# Patient Record
Sex: Female | Born: 1990 | Race: White | Hispanic: No | Marital: Single | Smoking: Former smoker
Health system: Southern US, Community
[De-identification: ages and names within clinical notes are randomized; demographics above are authoritative.]

## PROBLEM LIST (undated history)

## (undated) DIAGNOSIS — F419 Anxiety disorder, unspecified: Secondary | ICD-10-CM

## (undated) DIAGNOSIS — F909 Attention-deficit hyperactivity disorder, unspecified type: Secondary | ICD-10-CM

## (undated) DIAGNOSIS — F329 Major depressive disorder, single episode, unspecified: Secondary | ICD-10-CM

## (undated) DIAGNOSIS — Z9889 Other specified postprocedural states: Secondary | ICD-10-CM

## (undated) DIAGNOSIS — N83201 Unspecified ovarian cyst, right side: Secondary | ICD-10-CM

## (undated) DIAGNOSIS — N83202 Unspecified ovarian cyst, left side: Secondary | ICD-10-CM

## (undated) DIAGNOSIS — F32A Depression, unspecified: Secondary | ICD-10-CM

## (undated) HISTORY — PX: TONSILLECTOMY: SUR1361

## (undated) HISTORY — DX: Anxiety disorder, unspecified: F41.9

## (undated) HISTORY — DX: Unspecified ovarian cyst, left side: N83.202

## (undated) HISTORY — PX: TONSILLECTOMY AND ADENOIDECTOMY: SHX28

## (undated) HISTORY — DX: Attention-deficit hyperactivity disorder, unspecified type: F90.9

## (undated) HISTORY — DX: Major depressive disorder, single episode, unspecified: F32.9

## (undated) HISTORY — DX: Depression, unspecified: F32.A

## (undated) HISTORY — DX: Unspecified ovarian cyst, right side: N83.201

---
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
## 2011-07-16 ENCOUNTER — Encounter: Payer: Self-pay | Admitting: *Deleted

## 2011-07-16 ENCOUNTER — Emergency Department (HOSPITAL_COMMUNITY)
Admission: EM | Admit: 2011-07-16 | Discharge: 2011-07-16 | Discharge status: Against Medical Advice | Payer: BC Managed Care – PPO | Attending: Emergency Medicine | Admitting: Emergency Medicine

## 2011-07-16 DIAGNOSIS — R109 Unspecified abdominal pain: Secondary | ICD-10-CM | POA: Insufficient documentation

## 2011-07-16 DIAGNOSIS — R110 Nausea: Secondary | ICD-10-CM | POA: Insufficient documentation

## 2011-07-16 LAB — BASIC METABOLIC PANEL
BUN: 6 mg/dL (ref 6–23)
Calcium: 9.8 mg/dL (ref 8.4–10.5)
Creatinine, Ser: 0.68 mg/dL (ref 0.50–1.10)
GFR calc Af Amer: >90 mL/min (ref >90)

## 2011-07-16 LAB — URINALYSIS, ROUTINE W REFLEX MICROSCOPIC
Bilirubin Urine: NEGATIVE
Ketones, ur: NEGATIVE mg/dL
Leukocytes, UA: NEGATIVE
Nitrite: NEGATIVE
Protein, ur: NEGATIVE mg/dL

## 2011-07-16 LAB — POCT I-STAT, CHEM 8
Calcium, Ion: 1.22 mmol/L (ref 1.12–1.32)
Chloride: 105 mEq/L (ref 96–112)
Creatinine, Ser: 0.6 mg/dL (ref 0.50–1.10)
Glucose, Bld: 93 mg/dL (ref 70–99)
HCT: 45 % (ref 36.0–46.0)
Hemoglobin: 15.3 g/dL — ABNORMAL HIGH (ref 12.0–15.0)
Potassium: 3.8 mEq/L (ref 3.5–5.1)

## 2011-07-16 LAB — DIFFERENTIAL
Basophils Absolute: 0 10*3/uL (ref 0.0–0.1)
Basophils Relative: 0 % (ref 0–1)
Eosinophils Absolute: 0.1 10*3/uL (ref 0.0–0.7)
Eosinophils Relative: 1 % (ref 0–5)
Monocytes Absolute: 0.8 10*3/uL (ref 0.1–1.0)
Monocytes Relative: 8 % (ref 3–12)
Neutro Abs: 6.8 10*3/uL (ref 1.7–7.7)

## 2011-07-16 LAB — CBC
HCT: 41.7 % (ref 36.0–46.0)
MCH: 29.8 pg (ref 26.0–34.0)
MCHC: 34.1 g/dL (ref 30.0–36.0)
MCV: 87.4 fL (ref 78.0–100.0)
RDW: 12.3 % (ref 11.5–15.5)

## 2011-07-16 MED ORDER — ONDANSETRON HCL 4 MG/2ML IJ SOLN: 4.0000 mg | Freq: Once | INTRAMUSCULAR | Status: DC

## 2011-07-16 MED ORDER — SODIUM CHLORIDE 0.9 % IV BOLUS (SEPSIS): 1000.0000 mL | Freq: Once | INTRAVENOUS | Status: DC

## 2011-07-16 MED ORDER — HYDROMORPHONE HCL PF 1 MG/ML IJ SOLN: 1.0000 mg | Freq: Once | INTRAMUSCULAR | Status: DC

## 2011-07-16 NOTE — ED Notes (Signed)
Pt c/o of intermittent lower abdominal pain. 10/10 pain. Last bowel movement was yesterday morning. Pt report it to be normal. Denies any dysuria. Pt reports nausea last night.

## 2011-07-17 NOTE — ED Provider Notes (Signed)
History     CSN: [id number] Arrival date & time: 07/16/2011  1:31 AM   First MD Initiated Contact with Patient 07/16/11 0214      Chief Complaint  Patient presents with  . Abdominal Pain  . Abdominal Cramping    (Consider location/radiation/quality/duration/timing/severity/associated sxs/prior treatment) HPI Comments: Patient here with intermittent suprapubic lower abdominal pain - she states that since being on the implanon she does not have a period and will have occasional spotting - she states no fever or chills, denies vomiting but reports nausea last night - states no vaginal discharge or bleeding, no dysuria, flank pain.  She states that when the pain comes on it is sharp and stabbing, reports that right now her pain is low at 3/10  Patient is a 20 y.o. female presenting with abdominal pain and cramps. The history is provided by the patient and a parent. No language interpreter was used.  Abdominal Pain The primary symptoms of the illness include abdominal pain and nausea. The primary symptoms of the illness do not include fever, fatigue, shortness of breath, vomiting, diarrhea, hematochezia, dysuria, vaginal discharge or vaginal bleeding. The current episode started yesterday. The onset of the illness was gradual. The problem has not changed since onset. The patient states that she believes she is currently not pregnant. The patient has not had a change in bowel habit. Symptoms associated with the illness do not include chills, anorexia, heartburn, constipation, urgency, hematuria, frequency or back pain.  Abdominal Cramping The primary symptoms of the illness include abdominal pain and nausea. The primary symptoms of the illness do not include fever, fatigue, shortness of breath, vomiting, diarrhea, hematochezia, dysuria, vaginal discharge or vaginal bleeding.  Symptoms associated with the illness do not include chills, anorexia, heartburn, constipation, urgency, hematuria, frequency  or back pain.    History reviewed. No pertinent past medical history.  Past Surgical History  Procedure Date  . Tonsillectomy and adenoidectomy     History reviewed. No pertinent family history.  History  Substance Use Topics  . Smoking status: Former Games developer  . Smokeless tobacco: Not on file  . Alcohol Use: Yes    OB History    Grav Para Term Preterm Abortions TAB SAB Ect Mult Living                  Review of Systems  Constitutional: Negative for fever, chills and fatigue.  Respiratory: Negative for shortness of breath.   Gastrointestinal: Positive for nausea and abdominal pain. Negative for heartburn, vomiting, diarrhea, constipation, hematochezia and anorexia.  Genitourinary: Negative for dysuria, urgency, frequency, hematuria, vaginal bleeding and vaginal discharge.  Musculoskeletal: Negative for back pain.  All other systems reviewed and are negative.    Allergies  Review of patient's allergies indicates no known allergies.  Home Medications   Current Outpatient Rx  Name Route Sig Dispense Refill  . AMPHETAMINE-DEXTROAMPHETAMINE 20 MG PO TABS Oral Take 20 mg by mouth daily.      . IMPLANON [place] Subcutaneous Inject into the skin.      Marland Kitchen SERTRALINE HCL 25 MG PO TABS Oral Take by mouth daily.        BP 107/70  Pulse 72  Temp(Src) 98.7 F (37.1 C) (Oral)  Resp 18  SpO2 100%  Physical Exam  Nursing note and vitals reviewed. Constitutional: She is oriented to person, place, and time. She appears well-developed and well-nourished. No distress.  HENT:  Head: Normocephalic and atraumatic.  Right Ear: External ear normal.  Left  Ear: External ear normal.  Nose: Nose normal.  Mouth/Throat: Oropharynx is clear and moist. No oropharyngeal exudate.  Eyes: Conjunctivae are normal. Pupils are equal, round, and reactive to light.  Neck: Normal range of motion. Neck supple.  Cardiovascular: Normal rate and normal heart sounds.  Exam reveals no gallop and no friction  rub.   No murmur heard. Pulmonary/Chest: Effort normal and breath sounds normal.  Abdominal: Soft. Bowel sounds are normal. There is no hepatosplenomegaly. There is tenderness in the suprapubic area. There is no rigidity, no rebound, no guarding, no CVA tenderness and no tenderness at McBurney's point.    Genitourinary:       As this patient was seen in triage and sent back out to the waiting room - I had ordered a pelvic exam but she left before it could be done.  Musculoskeletal: Normal range of motion.  Neurological: She is alert and oriented to person, place, and time. No cranial nerve deficit.  Skin: Skin is warm and dry.  Psychiatric: She has a normal mood and affect. Her behavior is normal. Judgment and thought content normal.    ED Course  Procedures (including critical care time)  Labs Reviewed  POCT I-STAT, CHEM 8 - Abnormal; Notable for the following:    BUN 4 (*)    Hemoglobin 15.3 (*)    All other components within normal limits  CBC  DIFFERENTIAL  BASIC METABOLIC PANEL  URINALYSIS, ROUTINE W REFLEX MICROSCOPIC  PREGNANCY, URINE  LAB REPORT - SCANNED   No results found.  Abdominal pain   MDM  The patient was screened by me in triage and then sent back into the waiting room - when she was called for her room she was not there.  She was afebrile with normal vital signs before leaving, her pain was episodic in nature and I did not have a suspicion for a surgical issue.  At the time of my examination her pain was 3/10 and she did not appear to be that uncomfortable.        Izola Price Fall[place], [place] 07/17/11 [phone]

## 2011-07-25 NOTE — ED Provider Notes (Signed)
Medical screening examination/treatment/procedure(s) were performed by non-physician practitioner and as supervising physician I was immediately available for consultation/collaboration.   Carlisle Beers Maci Eickholt, MD 07/25/11 0700

## 2011-09-05 ENCOUNTER — Ambulatory Visit (INDEPENDENT_AMBULATORY_CARE_PROVIDER_SITE_OTHER): Payer: BC Managed Care – PPO

## 2012-03-01 ENCOUNTER — Ambulatory Visit (INDEPENDENT_AMBULATORY_CARE_PROVIDER_SITE_OTHER): Payer: BC Managed Care – PPO | Admitting: Family Medicine

## 2012-03-01 ENCOUNTER — Other Ambulatory Visit: Payer: Self-pay | Admitting: Family Medicine

## 2012-03-01 ENCOUNTER — Ambulatory Visit: Payer: BC Managed Care – PPO

## 2012-03-01 VITALS — BP 118/72 | HR 86 | Temp 97.8°F | Resp 17 | Ht 65.0 in | Wt 139.0 lb

## 2012-03-01 DIAGNOSIS — R1032 Left lower quadrant pain: Secondary | ICD-10-CM

## 2012-03-01 DIAGNOSIS — R112 Nausea with vomiting, unspecified: Secondary | ICD-10-CM

## 2012-03-01 DIAGNOSIS — R300 Dysuria: Secondary | ICD-10-CM

## 2012-03-01 LAB — COMPREHENSIVE METABOLIC PANEL WITH GFR
BUN: 12 mg/dL (ref 6–23)
CO2: 26 meq/L (ref 19–32)
Calcium: 10.3 mg/dL (ref 8.4–10.5)
Creat: 0.94 mg/dL (ref 0.50–1.10)
Glucose, Bld: 89 mg/dL (ref 70–99)
Total Bilirubin: 0.5 mg/dL (ref 0.3–1.2)

## 2012-03-01 LAB — POCT CBC
Granulocyte percent: 65.4 %G (ref 37–80)
HCT, POC: 52 % — AB (ref 37.7–47.9)
Hemoglobin: 16.2 g/dL (ref 12.2–16.2)
Lymph, poc: 2.7 (ref 0.6–3.4)
MCH, POC: 29.4 pg (ref 27–31.2)
MCHC: 31.2 g/dL — AB (ref 31.8–35.4)
MCV: 94.4 fL (ref 80–97)
MID (cbc): 0.7 (ref 0–0.9)
MPV: 7.7 fL (ref 0–99.8)
POC Granulocyte: 6.5 (ref 2–6.9)
POC LYMPH PERCENT: 27.7 %L (ref 10–50)
POC MID %: 6.9 % (ref 0–12)
Platelet Count, POC: 428 10*3/uL — AB (ref 142–424)
RBC: 5.51 M/uL — AB (ref 4.04–5.48)
RDW, POC: 13.1 %
WBC: 9.9 10*3/uL (ref 4.6–10.2)

## 2012-03-01 LAB — POCT UA - MICROSCOPIC ONLY
Casts, Ur, LPF, POC: NEGATIVE
Crystals, Ur, HPF, POC: NEGATIVE
Mucus, UA: NEGATIVE
Yeast, UA: NEGATIVE

## 2012-03-01 LAB — POCT URINALYSIS DIPSTICK
Bilirubin, UA: NEGATIVE
Glucose, UA: NEGATIVE
Ketones, UA: NEGATIVE
Nitrite, UA: NEGATIVE
Protein, UA: NEGATIVE
Spec Grav, UA: 1.025
Urobilinogen, UA: 0.2
pH, UA: 6

## 2012-03-01 LAB — COMPREHENSIVE METABOLIC PANEL
ALT: 12 U/L (ref 0–35)
AST: 16 U/L (ref 0–37)
Albumin: 4.9 g/dL (ref 3.5–5.2)
Alkaline Phosphatase: 60 U/L (ref 39–117)
Chloride: 109 mEq/L (ref 96–112)
Potassium: 4.7 mEq/L (ref 3.5–5.3)
Sodium: 140 mEq/L (ref 135–145)
Total Protein: 7.2 g/dL (ref 6.0–8.3)

## 2012-03-01 MED ORDER — KETOROLAC TROMETHAMINE 60 MG/2ML IM SOLN
60.0000 mg | Freq: Once | INTRAMUSCULAR | Status: AC
  Administered 2012-03-01: 60 mg via INTRAMUSCULAR

## 2012-03-01 MED ORDER — ONDANSETRON 8 MG PO TBDP: 8.0000 mg | ORAL_TABLET | Freq: Three times a day (TID) | ORAL | Status: AC | PRN

## 2012-03-01 MED ORDER — HYDROCODONE-ACETAMINOPHEN 5-500 MG PO TABS: 1.0000 | ORAL_TABLET | Freq: Three times a day (TID) | ORAL | Status: AC | PRN

## 2012-03-01 NOTE — Progress Notes (Signed)
[facility]:  Office Visit  Chief Complaint:  Chief Complaint  Patient presents with  . Back Pain    patient thinks she has a kidney stone     HPI: Suzanne Rice is a 21 y.o. female who complains of  Left sided abd pain which started this AM, started on left back pain and has now migrated to anterior left abd. Sharp 10/10 pain, has not tried anything. Deneis h/o kidney stones, has a h.o ovarian cysts but feels different. Was able to eat and drink well last night. Associated with Nausea but no vomiting. No abd surgery. Able to pass gas, last BM 2 days ago-was normal.  Past Medical History  Diagnosis Date  . Bilateral ovarian cysts   . Depression   . Anxiety   . ADHD (attention deficit hyperactivity disorder)    Past Surgical History  Procedure Date  . Tonsillectomy and adenoidectomy   . Tonsillectomy    History   Social History  . Marital Status: Single    Spouse Name: N/A    Number of Children: N/A  . Years of Education: N/A   Social History Main Topics  . Smoking status: Former Games developer  . Smokeless tobacco: None  . Alcohol Use: Yes  . Drug Use: No  . Sexually Active: Yes    Birth Control/ Protection: Condom, Pill   Other Topics Concern  . None   Social History Narrative  . None   No family history on file. No Known Allergies Prior to Admission medications   Medication Sig Start Date End Date Taking? Authorizing Provider  amphetamine-dextroamphetamine (ADDERALL) 20 MG tablet Take 20 mg by mouth daily.     Yes Historical Provider, MD  Etonogestrel (IMPLANON [place]) Inject into the skin.     Yes Historical Provider, MD  sertraline (ZOLOFT) 25 MG tablet Take by mouth daily.     Yes Historical Provider, MD     ROS: The patient denies fevers, chills, night sweats, unintentional weight loss, chest pain, palpitations, wheezing, dyspnea on exertion, + nausea, vomiting, abdominal pain, dysuria, hematuria, melena, numbness, weakness, or tingling.   All  other systems have been reviewed and were otherwise negative with the exception of those mentioned in the HPI and as above.    PHYSICAL EXAM: Filed Vitals:   03/01/12 1021  BP: 118/72  Pulse: 86  Temp: 97.8 F (36.6 C)  Resp: 17   Filed Vitals:   03/01/12 1021  Height: 5\' 5"  (1.651 m)  Weight: 139 lb (63.05 kg)   Body mass index is 23.13 kg/(m^2).  General: Alert, no acute distress HEENT:  Normocephalic, atraumatic, oropharynx patent.  Cardiovascular:  Regular rate and rhythm, no rubs murmurs or gallops.  No Carotid bruits, radial pulse intact. No pedal edema.  Respiratory: Clear to auscultation bilaterally.  No wheezes, rales, or rhonchi.  No cyanosis, no use of accessory musculature GI: No organomegaly, abdomen is soft and  + tender LLQ, positive bowel sounds.  No masses. Skin: No rashes. Neurologic: Facial musculature symmetric. Psychiatric: Patient is appropriate throughout our interaction. Lymphatic: No cervical lymphadenopathy Musculoskeletal: Gait intact. No CVA tenderness   LABS: Results for orders placed in visit on 03/01/12  POCT CBC      Component Value Range   WBC 9.9  4.6 - 10.2 K/uL   Lymph, poc 2.7  0.6 - 3.4   POC LYMPH PERCENT 27.7  10 - 50 %L   MID (cbc) 0.7  0 - 0.9  POC MID % 6.9  0 - 12 %M   POC Granulocyte 6.5  2 - 6.9   Granulocyte percent 65.4  37 - 80 %G   RBC 5.51 (*) 4.04 - 5.48 M/uL   Hemoglobin 16.2  12.2 - 16.2 g/dL   HCT, POC 16.1 (*) 09.6 - 47.9 %   MCV 94.4  80 - 97 fL   MCH, POC 29.4  27 - 31.2 pg   MCHC 31.2 (*) 31.8 - 35.4 g/dL   RDW, POC 04.5     Platelet Count, POC 428 (*) 142 - 424 K/uL   MPV 7.7  0 - 99.8 fL  POCT UA - MICROSCOPIC ONLY      Component Value Range   WBC, Ur, HPF, POC 1-4     RBC, urine, microscopic 4-6     Bacteria, U Microscopic trace     Mucus, UA neg     Epithelial cells, urine per micros 2-3     Crystals, Ur, HPF, POC neg     Casts, Ur, LPF, POC neg     Yeast, UA neg    POCT URINALYSIS DIPSTICK       Component Value Range   Color, UA yellow     Clarity, UA clear     Glucose, UA neg     Bilirubin, UA neg     Ketones, UA neg     Spec Grav, UA 1.025     Blood, UA mod     pH, UA 6.0     Protein, UA neg     Urobilinogen, UA 0.2     Nitrite, UA neg     Leukocytes, UA Trace       EKG/XRAY:   Primary read interpreted by Dr. Conley Rolls at [facility]. ? 12 mm renal stone LUQ abd + fecal matter,  Min dilation of large colon in LUQ abd   ASSESSMENT/PLAN: Encounter Diagnoses  Name Primary?  . Abdominal pain, acute, left lower quadrant Yes  . Dysuria   . Nausea & vomiting    ? Renal stone ( more likely) vs constipation Toradol 60 mg IM x 1 Vocodina and Zofran,  miralax Push fluids F/u prn    [name], DO 03/01/2012 11:52 AM

## 2012-03-03 ENCOUNTER — Telehealth: Payer: Self-pay | Admitting: Family Medicine

## 2012-03-03 DIAGNOSIS — R109 Unspecified abdominal pain: Secondary | ICD-10-CM

## 2012-03-03 LAB — URINE CULTURE
Colony Count: NO GROWTH
Organism ID, Bacteria: NO GROWTH

## 2012-03-03 NOTE — Telephone Encounter (Signed)
Spoke with patient about xray results and urine labs. She is having less pain. Desires a CT scan even though I d/w her the radiation potential and risk associated with it. She states that she still wants to have it even though she is feeling better. I advise her to wait but patient states she still has pressure on her bladder. She does have a h/o ovarian cyst.

## 2012-03-03 NOTE — Telephone Encounter (Signed)
Spoke with mom who was present for OV, patient is feeling better, no more pain after injection and fluids. She has not taken vicodin. Recommend stool softener as planned.

## 2012-03-16 ENCOUNTER — Other Ambulatory Visit: Payer: Self-pay | Admitting: Family Medicine

## 2012-03-16 DIAGNOSIS — R1032 Left lower quadrant pain: Secondary | ICD-10-CM

## 2013-01-31 LAB — OB RESULTS CONSOLE RPR: RPR: NONREACTIVE

## 2013-01-31 LAB — OB RESULTS CONSOLE ABO/RH: RH Type: NEGATIVE

## 2013-01-31 LAB — OB RESULTS CONSOLE GC/CHLAMYDIA
CHLAMYDIA, DNA PROBE: NEGATIVE
GC PROBE AMP, GENITAL: NEGATIVE

## 2013-01-31 LAB — OB RESULTS CONSOLE ANTIBODY SCREEN: ANTIBODY SCREEN: NEGATIVE

## 2013-01-31 LAB — OB RESULTS CONSOLE RUBELLA ANTIBODY, IGM: Rubella: IMMUNE

## 2013-01-31 LAB — OB RESULTS CONSOLE HEPATITIS B SURFACE ANTIGEN: Hepatitis B Surface Ag: NEGATIVE

## 2013-01-31 LAB — OB RESULTS CONSOLE HIV ANTIBODY (ROUTINE TESTING): HIV: NONREACTIVE

## 2013-02-27 ENCOUNTER — Other Ambulatory Visit: Payer: Self-pay

## 2013-04-12 ENCOUNTER — Other Ambulatory Visit (HOSPITAL_COMMUNITY): Payer: Self-pay | Admitting: Obstetrics & Gynecology

## 2013-04-12 DIAGNOSIS — O283 Abnormal ultrasonic finding on antenatal screening of mother: Secondary | ICD-10-CM

## 2013-04-18 ENCOUNTER — Other Ambulatory Visit: Payer: Self-pay

## 2013-04-18 ENCOUNTER — Ambulatory Visit (HOSPITAL_COMMUNITY)
Admission: RE | Admit: 2013-04-18 | Discharge: 2013-04-18 | Disposition: A | Discharge status: Home / Self Care | Payer: BC Managed Care – PPO | Source: Ambulatory Visit | Attending: Obstetrics & Gynecology | Admitting: Obstetrics & Gynecology

## 2013-04-18 ENCOUNTER — Encounter (HOSPITAL_COMMUNITY): Payer: Self-pay

## 2013-04-18 VITALS — BP 112/77 | HR 68 | Wt 162.0 lb

## 2013-04-18 DIAGNOSIS — O351XX0 Maternal care for (suspected) chromosomal abnormality in fetus, not applicable or unspecified: Secondary | ICD-10-CM | POA: Insufficient documentation

## 2013-04-18 DIAGNOSIS — Z3689 Encounter for other specified antenatal screening: Secondary | ICD-10-CM | POA: Insufficient documentation

## 2013-04-18 DIAGNOSIS — O09899 Supervision of other high risk pregnancies, unspecified trimester: Secondary | ICD-10-CM | POA: Insufficient documentation

## 2013-04-18 DIAGNOSIS — O99340 Other mental disorders complicating pregnancy, unspecified trimester: Secondary | ICD-10-CM | POA: Insufficient documentation

## 2013-04-18 DIAGNOSIS — F1910 Other psychoactive substance abuse, uncomplicated: Secondary | ICD-10-CM | POA: Insufficient documentation

## 2013-04-18 DIAGNOSIS — O283 Abnormal ultrasonic finding on antenatal screening of mother: Secondary | ICD-10-CM

## 2013-04-18 DIAGNOSIS — O358XX0 Maternal care for other (suspected) fetal abnormality and damage, not applicable or unspecified: Secondary | ICD-10-CM | POA: Insufficient documentation

## 2013-04-18 DIAGNOSIS — O3510X0 Maternal care for (suspected) chromosomal abnormality in fetus, unspecified, not applicable or unspecified: Secondary | ICD-10-CM | POA: Insufficient documentation

## 2013-04-18 NOTE — Progress Notes (Signed)
Suzanne Rice  was seen today for an ultrasound appointment.  See full report in [facility].  Comments: Suzanne Rice was seen today due to thickened nuchal fold and echogenic intracardiac focus that was noted on recent clinic ultrasound.  She previously had a normal first trimester screen and normal nuchal translucency.  On exam today, the nuchal fold measures 6-7 mm and an echogenic intracardiac focus was noted.  The findings and limitations of the study were discussed.  Both thickened nuchal translucency and echogenic intracardiac focus are considered weak markers for Down syndrome and together are associated with a liklihood ratio of approximately 10.  Nevertheless, based on her apriori risk for Trisomy 72, she is still at low risk.  After counseling, the patient elected to undergo cell free fetal DNA ([facility])  Impression: Single IUP at 19 4/7 weeks Normal first trimester screen Thickened nuchal fold, echogenic intracardiac focus- no other soft markers noted Fetal anatomy is otherwise within normal limits; somewhat limited views of the fetal heart obtained (arches) Normal amniotic fluid volume  Recommendations: Recommend follow-up ultrasound examination in 4 weeks to reevaluate the fetal heart.  Alpha Gula, MD

## 2013-05-01 ENCOUNTER — Telehealth (HOSPITAL_COMMUNITY): Payer: Self-pay | Admitting: MS"

## 2013-05-01 NOTE — Telephone Encounter (Signed)
Called Suzanne Rice to discuss her cell free fetal DNA test results.  Suzanne Rice had Harm[facility] testing through [facility].  Testing was offered because of previous ultrasound findings of thickened nuchal fold and echogenic intracardiac focus.   The patient was identified by name and DOB.  We reviewed that these are within normal limits, showing a less than 1 in 10,000 risk for trisomies 21, 18 and 13. X and Y analysis did not show evidence of sex chromosome aneuploidy, and results were consistent with female gender.  We reviewed that this testing identifies > 99% of pregnancies with trisomy 21, >98% of pregnancies with trisomy 70, and approximately 80% of pregnancies with trisomy 73.   The false positive rate is <0.1% for all conditions.  She understands that this testing does not identify all genetic conditions.  All questions were answered to her satisfaction, she was encouraged to call with additional questions or concerns.  Quinn Plowman, MS Certified Genetic Counselor 05/01/2013 9:14 AM

## 2013-05-16 ENCOUNTER — Ambulatory Visit (HOSPITAL_COMMUNITY)
Admission: RE | Admit: 2013-05-16 | Discharge: 2013-05-16 | Disposition: A | Discharge status: Home / Self Care | Payer: BC Managed Care – PPO | Source: Ambulatory Visit | Attending: Obstetrics & Gynecology | Admitting: Obstetrics & Gynecology

## 2013-05-16 DIAGNOSIS — O09899 Supervision of other high risk pregnancies, unspecified trimester: Secondary | ICD-10-CM | POA: Insufficient documentation

## 2013-05-16 DIAGNOSIS — O358XX0 Maternal care for other (suspected) fetal abnormality and damage, not applicable or unspecified: Secondary | ICD-10-CM | POA: Insufficient documentation

## 2013-05-16 DIAGNOSIS — F1910 Other psychoactive substance abuse, uncomplicated: Secondary | ICD-10-CM | POA: Insufficient documentation

## 2013-05-16 DIAGNOSIS — O283 Abnormal ultrasonic finding on antenatal screening of mother: Secondary | ICD-10-CM

## 2013-05-16 DIAGNOSIS — O99340 Other mental disorders complicating pregnancy, unspecified trimester: Secondary | ICD-10-CM | POA: Insufficient documentation

## 2013-08-03 NOTE — L&D Delivery Note (Signed)
SVD of VMI at 1030 on 08/21/13.  EBL: 400cc.  Placenta to L&D.  APGARs 8,9. Head delivered ROA with body following atraumatically.  Mouth and nose bulb suctioned.  Cord clamped, cut, and baby to abdomen.  Cord pH obtained.  Placenta delivered S/I/3VC.  Fundus firmed with pitocin and massage.  Left vaginal laceration repaired in the normal fashion with 3-0 Vicryl Rapide.  Right periurethral laceration hemostatic without repair.  Mom and baby stable.

## 2013-08-12 LAB — OB RESULTS CONSOLE GBS: GBS: NEGATIVE

## 2013-08-20 ENCOUNTER — Inpatient Hospital Stay (HOSPITAL_COMMUNITY): Payer: BC Managed Care – PPO | Admitting: Anesthesiology

## 2013-08-20 ENCOUNTER — Encounter (HOSPITAL_COMMUNITY): Payer: BC Managed Care – PPO | Admitting: Anesthesiology

## 2013-08-20 ENCOUNTER — Inpatient Hospital Stay (HOSPITAL_COMMUNITY)
Admission: AD | Admit: 2013-08-20 | Discharge: 2013-08-22 | DRG: 775 | Disposition: A | Discharge status: Home / Self Care | Payer: BC Managed Care – PPO | Source: Ambulatory Visit | Attending: Obstetrics & Gynecology | Admitting: Obstetrics & Gynecology

## 2013-08-20 ENCOUNTER — Encounter (HOSPITAL_COMMUNITY): Payer: Self-pay | Admitting: *Deleted

## 2013-08-20 DIAGNOSIS — Z3490 Encounter for supervision of normal pregnancy, unspecified, unspecified trimester: Secondary | ICD-10-CM

## 2013-08-20 DIAGNOSIS — Z87891 Personal history of nicotine dependence: Secondary | ICD-10-CM

## 2013-08-20 LAB — CBC
HEMATOCRIT: 38.6 % (ref 36.0–46.0)
Hemoglobin: 13.8 g/dL (ref 12.0–15.0)
MCH: 31.1 pg (ref 26.0–34.0)
MCHC: 35.8 g/dL (ref 30.0–36.0)
MCV: 86.9 fL (ref 78.0–100.0)
PLATELETS: 290 10*3/uL (ref 150–400)
RBC: 4.44 MIL/uL (ref 3.87–5.11)
RDW: 12.5 % (ref 11.5–15.5)
WBC: 16.9 10*3/uL — ABNORMAL HIGH (ref 4.0–10.5)

## 2013-08-20 LAB — TYPE AND SCREEN
ABO/RH(D): O POS
Antibody Screen: NEGATIVE

## 2013-08-20 LAB — RPR: RPR Ser Ql: NONREACTIVE

## 2013-08-20 LAB — ABO/RH: ABO/RH(D): O POS

## 2013-08-20 MED ORDER — OXYTOCIN BOLUS FROM INFUSION: 500.0000 mL | INTRAVENOUS | Status: DC

## 2013-08-20 MED ORDER — LIDOCAINE HCL (PF) 1 % IJ SOLN
INTRAMUSCULAR | Status: DC | PRN
  Administered 2013-08-20 (×2): 5 mL

## 2013-08-20 MED ORDER — OXYTOCIN 40 UNITS IN LACTATED RINGERS INFUSION - SIMPLE MED
62.5000 mL/h | INTRAVENOUS | Status: DC
  Administered 2013-08-21: 62.5 mL/h via INTRAVENOUS

## 2013-08-20 MED ORDER — LIDOCAINE HCL (PF) 1 % IJ SOLN: 30.0000 mL | INTRAMUSCULAR | Status: DC | PRN

## 2013-08-20 MED ORDER — EPHEDRINE 5 MG/ML INJ
10.0000 mg | INTRAVENOUS | Status: AC | PRN
  Administered 2013-08-20 (×2): 10 mg via INTRAVENOUS

## 2013-08-20 MED ORDER — DIPHENHYDRAMINE HCL 50 MG/ML IJ SOLN: 12.5000 mg | INTRAMUSCULAR | Status: DC | PRN

## 2013-08-20 MED ORDER — LACTATED RINGERS IV SOLN: INTRAVENOUS | Status: DC

## 2013-08-20 MED ORDER — ONDANSETRON HCL 4 MG/2ML IJ SOLN: 4.0000 mg | Freq: Four times a day (QID) | INTRAMUSCULAR | Status: DC | PRN

## 2013-08-20 MED ORDER — ACETAMINOPHEN 325 MG PO TABS
650.0000 mg | ORAL_TABLET | ORAL | Status: DC | PRN
Start: 2013-08-20 — End: 2013-08-20

## 2013-08-20 MED ORDER — ACETAMINOPHEN 325 MG PO TABS: 650.0000 mg | ORAL_TABLET | ORAL | Status: DC | PRN

## 2013-08-20 MED ORDER — CITRIC ACID-SODIUM CITRATE 334-500 MG/5ML PO SOLN: 30.0000 mL | ORAL | Status: DC | PRN

## 2013-08-20 MED ORDER — OXYCODONE-ACETAMINOPHEN 5-325 MG PO TABS: 1.0000 | ORAL_TABLET | ORAL | Status: DC | PRN

## 2013-08-20 MED ORDER — FLEET ENEMA 7-19 GM/118ML RE ENEM: 1.0000 | ENEMA | RECTAL | Status: DC | PRN

## 2013-08-20 MED ORDER — LACTATED RINGERS IV SOLN: 500.0000 mL | INTRAVENOUS | Status: DC | PRN

## 2013-08-20 MED ORDER — PHENYLEPHRINE 40 MCG/ML (10ML) SYRINGE FOR IV PUSH (FOR BLOOD PRESSURE SUPPORT)
80.0000 ug | PREFILLED_SYRINGE | INTRAVENOUS | Status: DC | PRN
  Filled 2013-08-20: qty 2

## 2013-08-20 MED ORDER — TERBUTALINE SULFATE 1 MG/ML IJ SOLN: 0.2500 mg | Freq: Once | INTRAMUSCULAR | Status: AC | PRN

## 2013-08-20 MED ORDER — LIDOCAINE HCL (PF) 1 % IJ SOLN
30.0000 mL | INTRAMUSCULAR | Status: DC | PRN
  Filled 2013-08-20: qty 30

## 2013-08-20 MED ORDER — PROMETHAZINE HCL 25 MG/ML IJ SOLN
12.5000 mg | Freq: Four times a day (QID) | INTRAMUSCULAR | Status: DC | PRN
  Administered 2013-08-20: 12.5 mg via INTRAVENOUS
  Filled 2013-08-20: qty 1

## 2013-08-20 MED ORDER — LACTATED RINGERS IV SOLN
INTRAVENOUS | Status: DC
  Administered 2013-08-20 – 2013-08-21 (×2): via INTRAVENOUS

## 2013-08-20 MED ORDER — FENTANYL 2.5 MCG/ML BUPIVACAINE 1/10 % EPIDURAL INFUSION (WH - ANES)
14.0000 mL/h | INTRAMUSCULAR | Status: DC | PRN
  Administered 2013-08-20 – 2013-08-21 (×2): 14 mL/h via EPIDURAL
  Filled 2013-08-20 (×2): qty 125

## 2013-08-20 MED ORDER — LACTATED RINGERS IV SOLN
500.0000 mL | Freq: Once | INTRAVENOUS | Status: AC
  Administered 2013-08-20: 500 mL via INTRAVENOUS

## 2013-08-20 MED ORDER — LACTATED RINGERS IV SOLN
500.0000 mL | INTRAVENOUS | Status: DC | PRN
  Administered 2013-08-21: 500 mL via INTRAVENOUS

## 2013-08-20 MED ORDER — PHENYLEPHRINE 40 MCG/ML (10ML) SYRINGE FOR IV PUSH (FOR BLOOD PRESSURE SUPPORT)
80.0000 ug | PREFILLED_SYRINGE | INTRAVENOUS | Status: DC | PRN
  Filled 2013-08-20: qty 2
  Filled 2013-08-20 (×2): qty 10

## 2013-08-20 MED ORDER — OXYTOCIN 40 UNITS IN LACTATED RINGERS INFUSION - SIMPLE MED
1.0000 m[IU]/min | INTRAVENOUS | Status: DC
  Administered 2013-08-20: 2 m[IU]/min via INTRAVENOUS
  Filled 2013-08-20: qty 1000

## 2013-08-20 MED ORDER — EPHEDRINE 5 MG/ML INJ
10.0000 mg | INTRAVENOUS | Status: DC | PRN
  Filled 2013-08-20: qty 4
  Filled 2013-08-20: qty 2
  Filled 2013-08-20: qty 4

## 2013-08-20 MED ORDER — OXYTOCIN 40 UNITS IN LACTATED RINGERS INFUSION - SIMPLE MED: 62.5000 mL/h | INTRAVENOUS | Status: DC

## 2013-08-20 MED ORDER — IBUPROFEN 600 MG PO TABS: 600.0000 mg | ORAL_TABLET | Freq: Four times a day (QID) | ORAL | Status: DC | PRN

## 2013-08-20 MED ORDER — BUTORPHANOL TARTRATE 1 MG/ML IJ SOLN
1.0000 mg | INTRAMUSCULAR | Status: DC | PRN
  Administered 2013-08-20: 1 mg via INTRAVENOUS
  Filled 2013-08-20: qty 1

## 2013-08-20 MED ORDER — FLEET ENEMA 7-19 GM/118ML RE ENEM: 1.0000 | ENEMA | Freq: Once | RECTAL | Status: DC

## 2013-08-20 MED ORDER — IBUPROFEN 600 MG PO TABS
600.0000 mg | ORAL_TABLET | Freq: Four times a day (QID) | ORAL | Status: DC | PRN
  Administered 2013-08-21: 600 mg via ORAL
  Filled 2013-08-20: qty 1

## 2013-08-20 MED ORDER — OXYCODONE-ACETAMINOPHEN 5-325 MG PO TABS
1.0000 | ORAL_TABLET | ORAL | Status: DC | PRN
  Administered 2013-08-21: 1 via ORAL
  Filled 2013-08-20: qty 1

## 2013-08-20 NOTE — H&P (Signed)
Suzanne Rice is a 23 y.o. female presenting at 6637.2 with SROM.  Prenatal course complicated by increased thickness of nuchal fold and echogenic intraventricular foci.  Cell free fetal DNA testing revealed normal genetics.  GBS negative. Maternal Medical History:  Reason for admission: Rupture of membranes.   Contractions: Onset was 1-2 hours ago.   Frequency: irregular.   Perceived severity is mild.    Fetal activity: Perceived fetal activity is normal.    Prenatal complications: Sonogram revealed increased nuchal skin thickness and EIF.  Was seen by MFM with follow up sonogram.  Had negative cell free fetal DNA testing.  Patient was on adderal early in pregnancy  Prenatal Complications - Diabetes: none.    OB History   Grav Para Term Preterm Abortions TAB SAB Ect Mult Living   1              Past Medical History  Diagnosis Date  . Bilateral ovarian cysts   . Depression   . Anxiety   . ADHD (attention deficit hyperactivity disorder)    Past Surgical History  Procedure Laterality Date  . Tonsillectomy and adenoidectomy    . Tonsillectomy     Family History: family history is not on file. Social History:  reports that she has quit smoking. She does not have any smokeless tobacco history on file. She reports that she drinks alcohol. She reports that she does not use illicit drugs.   Prenatal Transfer Tool  Maternal Diabetes: No Genetic Screening: Normal Maternal Ultrasounds/Referrals: Abnormal:  Findings:   Isolated EIF (echogenic intracardiac focus), Other: thickened nuchal fold Fetal Ultrasounds or other Referrals:  None Maternal Substance Abuse:  No Significant Maternal Medications:  Meds include: Other: adderall  Significant Maternal Lab Results:  None Other Comments:  None  ROS  Dilation: 1 Effacement (%): 100 Station: -2 Exam by:: Suzanne Rice Blood pressure 155/91, pulse 96, temperature 98.3 F (36.8 C), temperature source Oral, height 5\' 5"  (1.651 m), weight  99.338 kg (219 lb), last menstrual period 12/02/2012. Maternal Exam:  Uterine Assessment: Contraction strength is mild.  Contraction frequency is irregular.   Abdomen: Patient reports no abdominal tenderness. Fundal height is c/w dates.   Estimated fetal weight is 7.   Fetal presentation: vertex  Introitus: Amniotic fluid character: clear.  Cervix: One cm and 100% effaced. Vertex at -1.  Physical Exam  Prenatal labs: ABO, Rh:   Antibody:   Rubella:   RPR:    HBsAg:    HIV:    GBS:     Assessment/Plan: Intrauterine pregnancy at term with SROM. Abnormal fetal sono with normal genetic studies Routine labor and delivery   Suzanne Rice S 08/20/2013, 2:24 PM

## 2013-08-20 NOTE — MAU Note (Signed)
Pt presents with complaints of a large gush of fluid around 1230.

## 2013-08-20 NOTE — Anesthesia Procedure Notes (Signed)
Epidural Patient location during procedure: OB Start time: 08/20/2013 8:01 PM  Staffing Anesthesiologist: Brayton CavesJACKSON, Indianna Boran Performed by: anesthesiologist   Preanesthetic Checklist Completed: patient identified, site marked, surgical consent, pre-op evaluation, timeout performed, IV checked, risks and benefits discussed and monitors and equipment checked  Epidural Patient position: sitting Prep: site prepped and draped and DuraPrep Patient monitoring: continuous pulse ox and blood pressure Approach: midline Injection technique: LOR air  Needle:  Needle type: Tuohy  Needle gauge: 17 G Needle length: 9 cm and 9 Needle insertion depth: 6 cm Catheter type: closed end flexible Catheter size: 19 Gauge Catheter at skin depth: 11 cm Test dose: negative  Assessment Events: blood not aspirated, injection not painful, no injection resistance, negative IV test and no paresthesia  Additional Notes Patient identified.  Risk benefits discussed including failed block, incomplete pain control, headache, nerve damage, paralysis, blood pressure changes, nausea, vomiting, reactions to medication both toxic or allergic, and postpartum back pain.  Patient expressed understanding and wished to proceed.  All questions were answered.  Sterile technique used throughout procedure and epidural site dressed with sterile barrier dressing. No paresthesia or other complications noted.The patient did not experience any signs of intravascular injection such as tinnitus or metallic taste in mouth nor signs of intrathecal spread such as rapid motor block. Please see nursing notes for vital signs.

## 2013-08-20 NOTE — Progress Notes (Signed)
Notified Dr. Arelia SneddonMcComb of pt receiving epidural and prolonged fhr coinciding with pt's BP dropping. Informed of interventions performed, including D/C pitocin and fhr returning to baseline. Order received to restart pitocin per previous order at 2 mu/min and to place foley catheter prn.

## 2013-08-20 NOTE — Anesthesia Preprocedure Evaluation (Signed)

## 2013-08-21 ENCOUNTER — Encounter (HOSPITAL_COMMUNITY): Payer: Self-pay | Admitting: *Deleted

## 2013-08-21 MED ORDER — SIMETHICONE 80 MG PO CHEW: 80.0000 mg | CHEWABLE_TABLET | ORAL | Status: DC | PRN

## 2013-08-21 MED ORDER — ONDANSETRON HCL 4 MG PO TABS: 4.0000 mg | ORAL_TABLET | ORAL | Status: DC | PRN

## 2013-08-21 MED ORDER — LANOLIN HYDROUS EX OINT: TOPICAL_OINTMENT | CUTANEOUS | Status: DC | PRN

## 2013-08-21 MED ORDER — ZOLPIDEM TARTRATE 5 MG PO TABS: 5.0000 mg | ORAL_TABLET | Freq: Every evening | ORAL | Status: DC | PRN

## 2013-08-21 MED ORDER — SENNOSIDES-DOCUSATE SODIUM 8.6-50 MG PO TABS
2.0000 | ORAL_TABLET | ORAL | Status: DC
  Administered 2013-08-22: 2 via ORAL
  Filled 2013-08-21: qty 2

## 2013-08-21 MED ORDER — DIBUCAINE 1 % RE OINT
1.0000 "application " | TOPICAL_OINTMENT | RECTAL | Status: DC | PRN
  Filled 2013-08-21: qty 28

## 2013-08-21 MED ORDER — DIPHENHYDRAMINE HCL 25 MG PO CAPS: 25.0000 mg | ORAL_CAPSULE | Freq: Four times a day (QID) | ORAL | Status: DC | PRN

## 2013-08-21 MED ORDER — PRENATAL MULTIVITAMIN CH
1.0000 | ORAL_TABLET | Freq: Every day | ORAL | Status: DC
  Administered 2013-08-21 – 2013-08-22 (×2): 1 via ORAL
  Filled 2013-08-21 (×2): qty 1

## 2013-08-21 MED ORDER — BUPIVACAINE HCL (PF) 0.25 % IJ SOLN
INTRAMUSCULAR | Status: DC | PRN
  Administered 2013-08-21: 10 mL via EPIDURAL

## 2013-08-21 MED ORDER — WITCH HAZEL-GLYCERIN EX PADS: 1.0000 "application " | MEDICATED_PAD | CUTANEOUS | Status: DC | PRN

## 2013-08-21 MED ORDER — IBUPROFEN 600 MG PO TABS
600.0000 mg | ORAL_TABLET | Freq: Four times a day (QID) | ORAL | Status: DC
  Administered 2013-08-21 – 2013-08-22 (×4): 600 mg via ORAL
  Filled 2013-08-21 (×4): qty 1

## 2013-08-21 MED ORDER — OXYCODONE-ACETAMINOPHEN 5-325 MG PO TABS: 1.0000 | ORAL_TABLET | ORAL | Status: DC | PRN

## 2013-08-21 MED ORDER — TETANUS-DIPHTH-ACELL PERTUSSIS 5-2.5-18.5 LF-MCG/0.5 IM SUSP: 0.5000 mL | Freq: Once | INTRAMUSCULAR | Status: DC

## 2013-08-21 MED ORDER — BENZOCAINE-MENTHOL 20-0.5 % EX AERO
1.0000 "application " | INHALATION_SPRAY | CUTANEOUS | Status: DC | PRN
  Administered 2013-08-21: 1 via TOPICAL
  Filled 2013-08-21 (×2): qty 56

## 2013-08-21 MED ORDER — ONDANSETRON HCL 4 MG/2ML IJ SOLN: 4.0000 mg | INTRAMUSCULAR | Status: DC | PRN

## 2013-08-21 NOTE — Lactation Note (Signed)
This note was copied from the chart of Suzanne Rice Mccaslin. Lactation Consultation Note  Patient Name: Suzanne Rice Infinger NFAOZ'HToday's Date: 08/21/2013 Reason for consult: Initial assessment Mom reports baby has latched well a few times. BF basics reviewed with parents. Lactation brochure left for review, advised of OP services and support group. Encouraged to call for assist as needed.   Maternal Data Formula Feeding for Exclusion: No Infant to breast within first hour of birth: Yes Has patient been taught Hand Expression?: Yes Does the patient have breastfeeding experience prior to this delivery?: No  Feeding Feeding Type: Breast Fed Length of feed: 10 min  LATCH Score/Interventions                      Lactation Tools Discussed/Used     Consult Status Consult Status: Follow-up Date: 08/22/13 Follow-up type: In-patient    Alfred LevinsGranger, Notnamed Scholz Ann 08/21/2013, 11:30 PM

## 2013-08-21 NOTE — Anesthesia Postprocedure Evaluation (Signed)
  Anesthesia Post-op Note Anesthesia Post Note  Patient: Suzanne Rice  Procedure(s) Performed: * No procedures listed *  Anesthesia type: Epidural  Patient location: Mother/Baby  Post pain: Pain level controlled  Post assessment: Post-op Vital signs reviewed  Last Vitals:  Filed Vitals:   08/21/13 1231  BP: 129/99  Pulse: 96  Temp:   Resp:     Post vital signs: Reviewed  Level of consciousness:alert  Complications: No apparent anesthesia complications

## 2013-08-21 NOTE — Progress Notes (Signed)
I&O cath for 350 cc.

## 2013-08-21 NOTE — Progress Notes (Signed)
Patient has been pushing for 1 hr 45 min; station +2.  FHT Cat II (due to variables with CTX/pushes; moderate variability).  She is uncomfortable and tired.  Counseled patient re: options to redose epidural vs C/S.  At this time, she wishes to re-dose and continue to push.  She understands that should the fetal status become concerning or there is no continued descent, we will proceed to C/S.  Will recheck after more comfortable to assess position and pushing effort.

## 2013-08-21 NOTE — Progress Notes (Addendum)
Patient was referred for history of depression/anxiety. * Referral screened out by Clinical Social Worker because none of the following criteria appear to apply: ~ History of anxiety/depression during this pregnancy, or of post-partum depression. ~ Diagnosis of anxiety and/or depression within last 3 years ~ History of depression due to pregnancy loss/loss of child OR * Patient's symptoms currently being treated with medication and/or therapy. Please contact the Clinical Social Worker if needs arise, or if patient requests.  PNR states MOB had symptoms of Anxiety and Depression as a side effect of taking Adderall for ADHD.  MOB weaned off of Adderall for pregnancy.  MOB had rx for Zoloft while taking Adderall.  MOB also has an NP at Dr. Lugo's (psychiatrist) office. 

## 2013-08-21 NOTE — Progress Notes (Signed)
Requesting bolus for epidural.

## 2013-08-21 NOTE — Progress Notes (Signed)
CRNA to bolus.

## 2013-08-22 LAB — CBC
HEMATOCRIT: 28 % — AB (ref 36.0–46.0)
HEMOGLOBIN: 9.9 g/dL — AB (ref 12.0–15.0)
MCH: 31.4 pg (ref 26.0–34.0)
MCHC: 35.4 g/dL (ref 30.0–36.0)
MCV: 88.9 fL (ref 78.0–100.0)
Platelets: 233 10*3/uL (ref 150–400)
RBC: 3.15 MIL/uL — AB (ref 3.87–5.11)
RDW: 13.2 % (ref 11.5–15.5)
WBC: 15.4 10*3/uL — AB (ref 4.0–10.5)

## 2013-08-22 MED ORDER — OXYCODONE-ACETAMINOPHEN 5-325 MG PO TABS: 1.0000 | ORAL_TABLET | ORAL | Status: DC | PRN

## 2013-08-22 MED ORDER — IBUPROFEN 600 MG PO TABS: 600.0000 mg | ORAL_TABLET | Freq: Four times a day (QID) | ORAL | Status: DC

## 2013-08-22 NOTE — Discharge Summary (Signed)
Obstetric Discharge Summary Reason for Admission: rupture of membranes Prenatal Procedures: ultrasound Intrapartum Procedures: spontaneous vaginal delivery Postpartum Procedures: none Complications-Operative and Postpartum: vaginal laceration Hemoglobin  Date Value Range Status  08/22/2013 9.9* 12.0 - 15.0 g/dL Final     DELTA CHECK NOTED     REPEATED TO VERIFY  03/01/2012 16.2  12.2 - 16.2 g/dL Final     HCT  Date Value Range Status  08/22/2013 28.0* 36.0 - 46.0 % Final     HCT, POC  Date Value Range Status  03/01/2012 52.0* 37.7 - 47.9 % Final    Physical Exam:  General: alert and cooperative Lochia: appropriate Uterine Fundus: firm Incision: perineum intact DVT Evaluation: No evidence of DVT seen on physical exam. Negative Homan's sign. No cords or calf tenderness. Calf/Ankle edema is present.  Discharge Diagnoses: Term Pregnancy-delivered  Discharge Information: Date: 08/22/2013 Activity: pelvic rest Diet: routine Medications: PNV, Ibuprofen and Percocet Condition: stable Instructions: refer to practice specific booklet Discharge to: home   Newborn Data: Live born female  Birth Weight: 7 lb 9.5 oz (3445 g) APGAR: 8, 9  Home with mother.  Katisha Shimizu G 08/22/2013, 8:13 AM

## 2013-08-22 NOTE — Progress Notes (Signed)
Post Partum Day 1 Subjective: no complaints, up ad lib, voiding, tolerating PO and desires early discharge  Objective: Blood pressure 113/72, pulse 75, temperature 97.1 F (36.2 C), temperature source Oral, resp. rate 18, height 5\' 4"  (1.626 m), weight 219 lb (99.338 kg), last menstrual period 12/02/2012, SpO2 99.00%, unknown if currently breastfeeding.  Physical Exam:  General: alert and cooperative Lochia: appropriate Uterine Fundus: firm Incision: perineum intact DVT Evaluation: No evidence of DVT seen on physical exam. Negative Homan's sign. No cords or calf tenderness. Calf/Ankle edema is present.   Recent Labs  08/20/13 1450 08/22/13 0620  HGB 13.8 9.9*  HCT 38.6 28.0*    Assessment/Plan: Discharge home and Circumcision prior to discharge   LOS: 2 days   Megann Easterwood G 08/22/2013, 8:06 AM

## 2013-08-23 ENCOUNTER — Ambulatory Visit: Payer: Self-pay

## 2013-08-23 NOTE — Lactation Note (Signed)
This note was copied from the chart of Boy Ascencion Dikeara Grossi. Lactation Consultation Note  Patient Name: Boy Ascencion Dikeara Vicario EAVWU'JToday's Date: 08/23/2013   Visited with Mom and FOB on day of discharge, baby at 4147 hrs old.  Mom denies any difficulty with latching, no discomfort.  Baby has adequate output, and WNL weight loss.  Encouraged continued skin to skin, and feeding often on cue.  Engorgement prevention and treatment discussed.  Reminded Mom of OP lactation services available to her.  Baby to Pediatrician tomorrow am.  Encouraged to call prn.            Judee ClaraSmith, Weslie Rasmus E 08/23/2013, 10:25 AM

## 2014-04-06 ENCOUNTER — Ambulatory Visit (INDEPENDENT_AMBULATORY_CARE_PROVIDER_SITE_OTHER): Payer: BC Managed Care – PPO

## 2014-04-06 ENCOUNTER — Ambulatory Visit (INDEPENDENT_AMBULATORY_CARE_PROVIDER_SITE_OTHER): Payer: BC Managed Care – PPO | Admitting: Physician Assistant

## 2014-04-06 VITALS — BP 124/78 | HR 82 | Temp 98.2°F | Resp 16 | Ht 64.5 in | Wt 204.0 lb

## 2014-04-06 DIAGNOSIS — M25552 Pain in left hip: Secondary | ICD-10-CM

## 2014-04-06 DIAGNOSIS — M5442 Lumbago with sciatica, left side: Secondary | ICD-10-CM

## 2014-04-06 DIAGNOSIS — E669 Obesity, unspecified: Secondary | ICD-10-CM | POA: Insufficient documentation

## 2014-04-06 DIAGNOSIS — F53 Postpartum depression: Secondary | ICD-10-CM | POA: Insufficient documentation

## 2014-04-06 DIAGNOSIS — M5430 Sciatica, unspecified side: Secondary | ICD-10-CM

## 2014-04-06 DIAGNOSIS — O99345 Other mental disorders complicating the puerperium: Secondary | ICD-10-CM

## 2014-04-06 DIAGNOSIS — M25559 Pain in unspecified hip: Secondary | ICD-10-CM

## 2014-04-06 MED ORDER — MELOXICAM 15 MG PO TABS: 15.0000 mg | ORAL_TABLET | Freq: Every day | ORAL | Status: DC

## 2014-04-06 MED ORDER — CYCLOBENZAPRINE HCL 10 MG PO TABS: 10.0000 mg | ORAL_TABLET | Freq: Every day | ORAL | Status: DC

## 2014-04-06 NOTE — Progress Notes (Signed)
Subjective:    Patient ID: Suzanne Rice, female    DOB: 01-Aug-1991, 23 y.o.   MRN: [id number]   PCP: No PCP Per Patient  Chief Complaint  Patient presents with  . Hip Pain    x last night   . Back Pain    left lower     Medications, allergies, past medical history, surgical history, family history, social history and problem list reviewed and updated.  Patient Active Problem List   Diagnosis Date Noted  . 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 FATIGUE 10/21/2007    Prior to Admission medications   Medication Sig Start Date End Date Taking? Authorizing Provider  Escitalopram Oxalate (LEXAPRO PO) Take by mouth daily.   Yes Historical Provider, MD  medroxyPROGESTERone (DEPO-PROVERA) 150 MG/ML injection Inject 150 mg into the muscle every 3 (three) months.   Yes Historical Provider, MD    HPI  At work last night (3rd shift).  Stands most of the night.  Got tired, and sat for about an hour.  When she got up, about 5 am, had excruciating pain.  Pain with lifting her foot, not with weight bearing. Lasted 3 hours.  Had one previous episode last week, but only lasted 10 minutes, "I was able to walk it off." She had to leave work early.  Driving home, she shifted, trying to take the weight off the hip, she had increased pain.  She pain also shoots down the LEFT leg, almost to the knee, and has pain in the lower back.  She carries her 78 month old son on her LEFT hip most of the time.  No numbness or tingling. No weakness.  Review of Systems     Objective:   Physical Exam  Constitutional: She is oriented to person, place, and time. She appears well-developed and well-nourished. No distress.  BP 124/78  Pulse 82  Temp(Src) 98.2 F (36.8 C) (Oral)  Resp 16  Ht 5' 4.5" (1.638 m)  Wt 204 lb (92.534 kg)  BMI 34.49 kg/m2  SpO2 98%  Breastfeeding? No   Cardiovascular: Normal rate, regular rhythm, normal heart sounds and intact distal pulses.   Pulmonary/Chest: Effort normal and breath  sounds normal.  Abdominal: Soft. Bowel sounds are normal. There is no tenderness.  Musculoskeletal:       Left hip: She exhibits tenderness (greater tronchanter). She exhibits normal range of motion, normal strength and no swelling.       Lumbar back: She exhibits tenderness. She exhibits normal range of motion, no bony tenderness, no pain and no spasm.       Back:  Neurological: She is alert and oriented to person, place, and time. She has normal strength and normal reflexes. She displays no atrophy and no tremor. No sensory deficit. She exhibits normal muscle tone. She displays no seizure activity.  Skin: Skin is warm, dry and intact.  Psychiatric: She has a normal mood and affect. Her speech is normal and behavior is normal.    LS Spine: [facility] reading (PRIMARY) by  Dr. Merla Riches. Normal LS Spine.  LEFT Hip: [facility] reading (PRIMARY) by  Dr. Merla Riches. Normal LEFT hip.        Assessment & Plan:  1. Left-sided low back pain with left-sided sciatica - DG Lumbar Spine Complete; Future  2. Hip pain, left - DG Hip Complete Left; Future  Likely lumbar strain with sciatica. Meloxicam and bedtime flexeril. Once the acute pain is resolved, core strengthening and weight loss will help.  Johnay Mano  S. Ginnie Marich, PA-C Physician Assistant-Certified [facility]  

## 2014-04-06 NOTE — Patient Instructions (Signed)
Take medications as prescribed. Ice can help. Once the acute pain is resolved, working on core strengthening and regular cardiovascular exercise, and healthy eating is important for weight loss.

## 2014-04-30 ENCOUNTER — Other Ambulatory Visit: Payer: Self-pay | Admitting: Physician Assistant

## 2014-05-01 NOTE — Telephone Encounter (Signed)
[name], do you want to give pt RFs or re-check?

## 2014-06-18 ENCOUNTER — Other Ambulatory Visit: Payer: Self-pay | Admitting: Physician Assistant

## 2014-06-22 ENCOUNTER — Other Ambulatory Visit: Payer: Self-pay | Admitting: Physician Assistant

## 2014-06-22 NOTE — Telephone Encounter (Signed)
Do you want to give pt RFs or have her RTC?

## 2014-10-24 ENCOUNTER — Other Ambulatory Visit: Payer: Self-pay | Admitting: Physician Assistant

## 2016-06-21 IMAGING — CR DG HIP (WITH OR WITHOUT PELVIS) 2-3V*L*
2 series · 2 of 2 positions shown · non-contrast
Comparison: No similar prior exam is available at this institution
for comparison or on [HOSPITAL] PACS.

CLINICAL DATA: Left hip pain

EXAM:
LEFT HIP - COMPLETE 2+ VIEW

[AP]
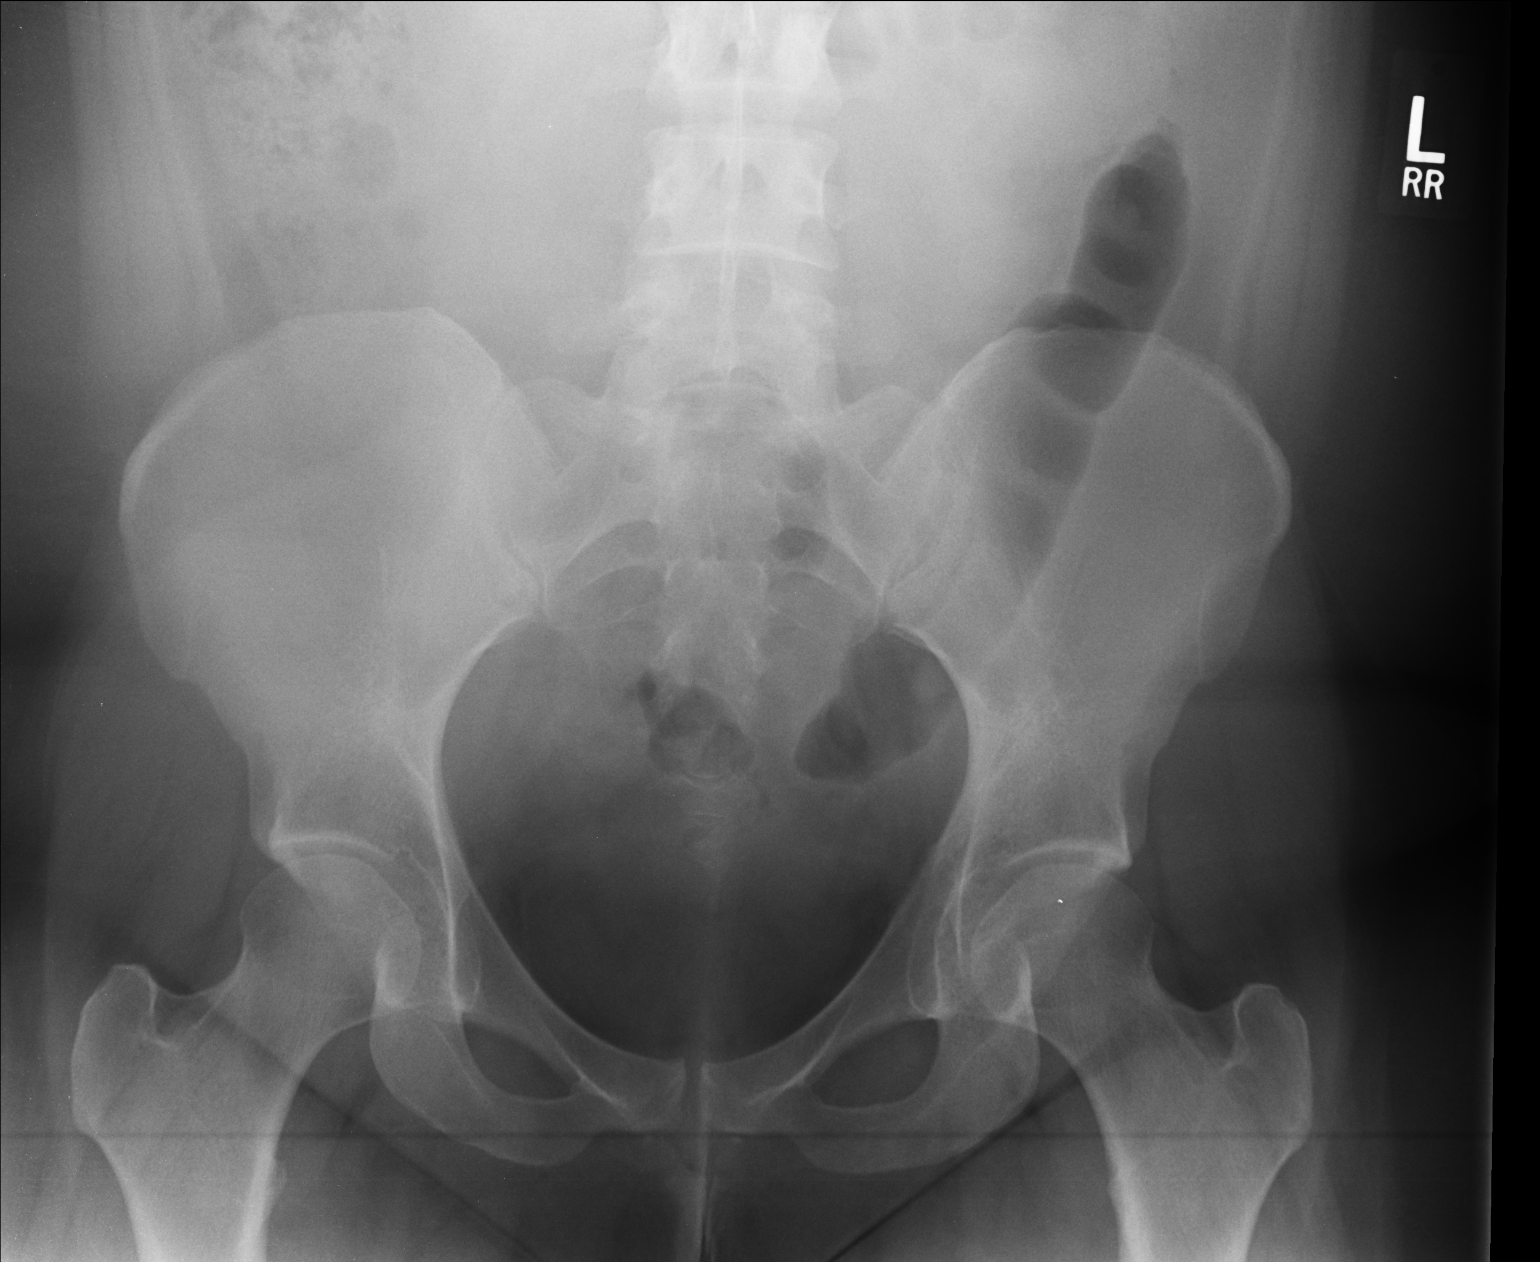

[lateral]
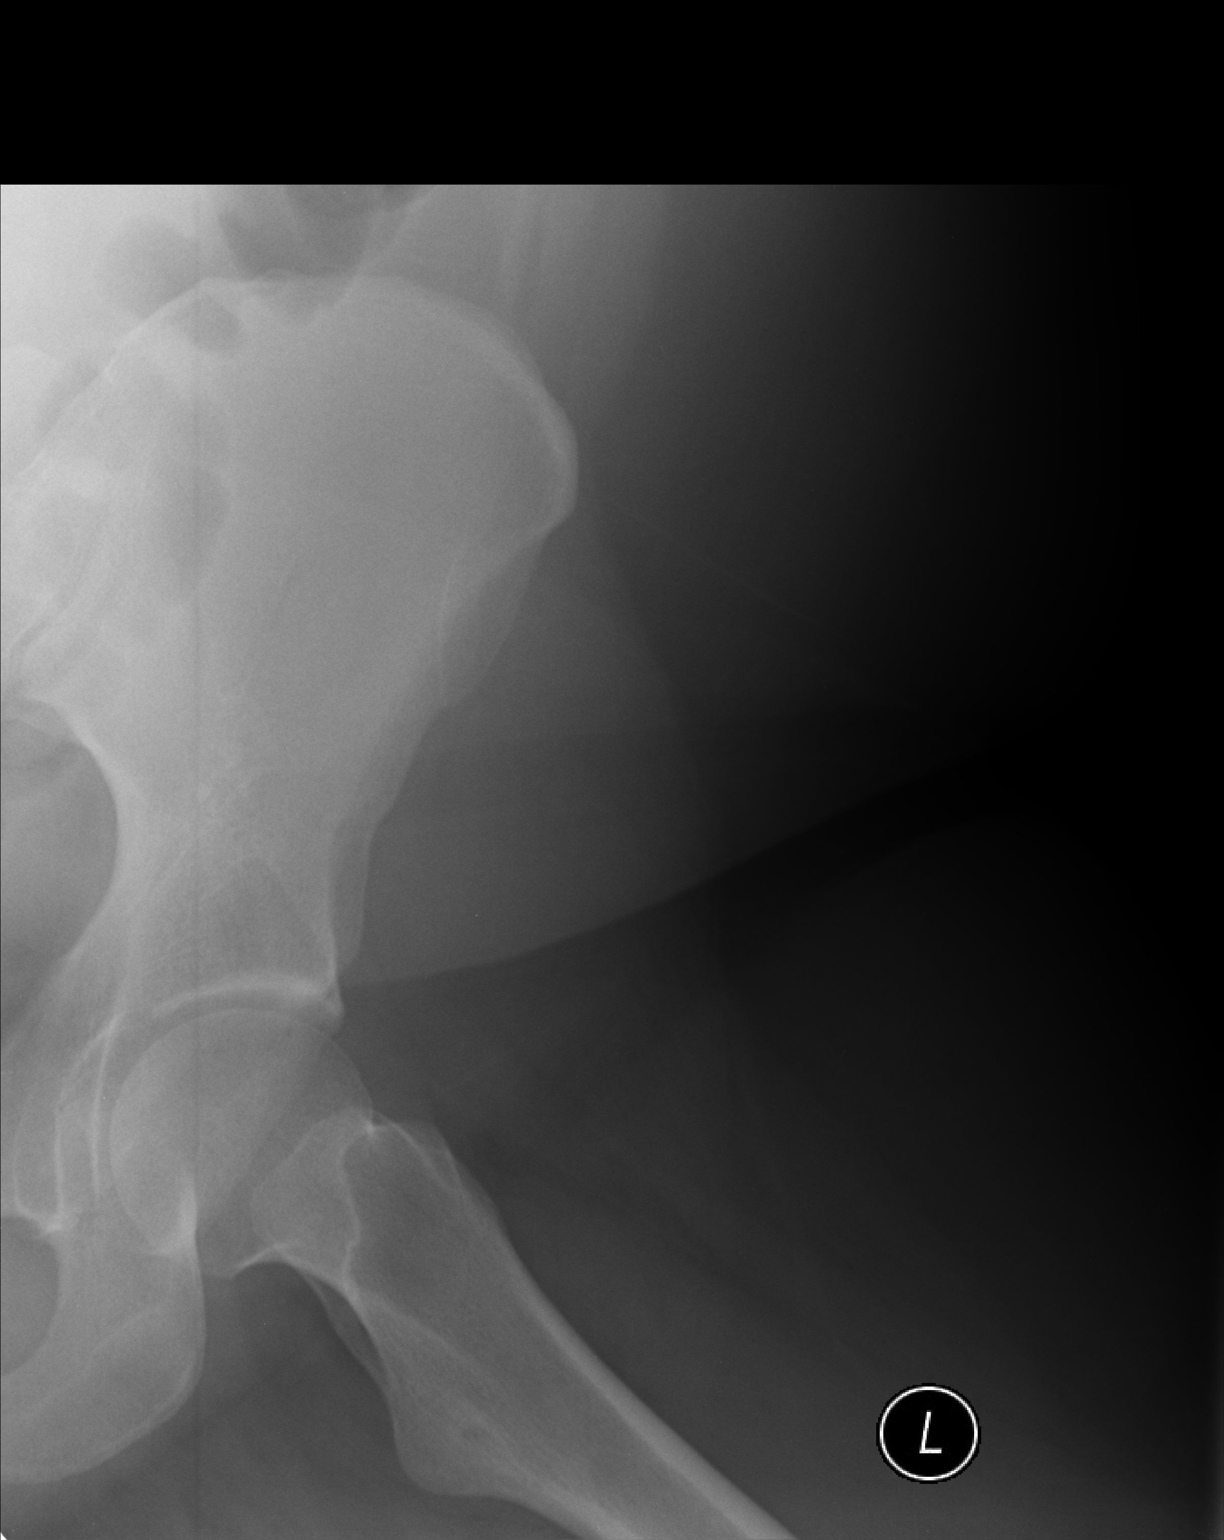

[2 of 2 positions shown; findings below may reference images not displayed]

FINDINGS: There is no evidence of hip fracture or dislocation. There is no
evidence of arthropathy or other focal bone abnormality.
IMPRESSION: Negative.

## 2019-12-22 ENCOUNTER — Ambulatory Visit (INDEPENDENT_AMBULATORY_CARE_PROVIDER_SITE_OTHER): Payer: Managed Care, Other (non HMO) | Admitting: Family Medicine

## 2019-12-22 ENCOUNTER — Other Ambulatory Visit: Payer: Self-pay

## 2019-12-22 VITALS — BP 112/82 | Ht 64.0 in | Wt 210.0 lb

## 2019-12-22 DIAGNOSIS — M79646 Pain in unspecified finger(s): Secondary | ICD-10-CM | POA: Diagnosis not present

## 2019-12-22 DIAGNOSIS — M25531 Pain in right wrist: Secondary | ICD-10-CM

## 2019-12-22 DIAGNOSIS — M25532 Pain in left wrist: Secondary | ICD-10-CM | POA: Diagnosis not present

## 2019-12-22 DIAGNOSIS — M79643 Pain in unspecified hand: Secondary | ICD-10-CM | POA: Diagnosis not present

## 2019-12-22 MED ORDER — MELOXICAM 15 MG PO TABS: 15.0000 mg | ORAL_TABLET | Freq: Every day | ORAL | 0 refills | Status: AC

## 2019-12-22 NOTE — Progress Notes (Signed)
  Suzanne Rice - 29 y.o. female MRN [id number]  Date of birth: [birth year]  SUBJECTIVE:   CC: bilateral wrist pain, swelling  Suzanne Rice is a 29 year old female presenting with bilateral wrist pain for the past 5 weeks.  She reports that she did a weekend of painting and after this time, she had pain in both wrists as well as swelling, with right worse than left.  She reports stiffness as well as swelling and stiffness in her fingers, with her right third DIP and PIP feeling especially stiff and swollen.  The pain is worse in the morning and improves over time.  She also has a history of right knee pain and swelling that improved with meloxicam.  She has not taken any medications for pain at this time.  She reports various joint pains over time and is concerned that she may have rheumatoid arthritis as her grandmother has RA and her mother also has symptoms of RA but no official diagnosis.  She shows a picture taken yesterday morning where her right wrist, hand, and fingers are significantly more swollen than her left.  She saw her PCP earlier this week and had rheumatologic labs that were reportedly normal.   ROS: No unexpected weight loss, fever, chills, muscle pain, numbness/tingling, redness, otherwise see HPI   PMHx - Updated and reviewed.  Contributory factors include: Negative PSHx - Updated and reviewed.  Contributory factors include:  Negative FHx - Updated and reviewed.  Contributory factors include:  Negative Social Hx - Updated and reviewed. Contributory factors include: Negative Medications - reviewed   DATA REVIEWED: none  PHYSICAL EXAM:  VS: BP:112/82  HR: bpm  TEMP: ( )  RESP:   HT:'5\' 4"'$  (162.6 cm)   WT:210 lb (95.3 kg)  BMI:36.03 PHYSICAL EXAM: Gen: NAD, alert, cooperative with exam, well-appearing HEENT: clear conjunctiva,  CV:  no edema, capillary refill brisk, normal rate Resp: non-labored Skin: no rashes, normal turgor  Neuro: no gross deficits.  Psych:  alert and  oriented  Right Hand: Inspection: No obvious deformity. Some swelling noted in 3rd finger Palpation: TTP over ulnar styloid, general wrist tenderness ROM: Full ROM of the digits and wrist. Fully able to extend and flex finger. Strength: 5/5 strength in the forearm, wrist and interosseus muscles Neurovascular: NV intact Special tests: negative tinel's at the carpal tunnel, negative Phalen's and reverse Phalen's  Left hand: Inspection: No obvious deformity. No swelling, erythema or bruising Palpation: TTP over ulnar styloid, general wrist tenderness ROM: Full ROM of the digits and wrist. Fully able to extend and flex finger. Strength: 5/5 strength in the forearm, wrist and interosseus muscles Neurovascular: NV intact Special tests: negative tinel's at the carpal tunnel, negative Phalen's and reverse Phalen's   ASSESSMENT & PLAN:  29 year old female presenting with bilateral wrist pain and swelling for the past 5 weeks.  Her symptoms appear to be rheumatologic in nature as she has no acute injury and also has a history of general joint pain and a family history of rheumatoid arthritis.  Although her ANA, RA, CRP, ESR were normal when obtained on 5/18, she could very well have a seronegative arthropathy or an early rheumatologic disease that has not yet have lab findings.  Will treat with meloxicam as needed for pain.  Recommended that she get a referral to rheumatology if symptoms persist.

## 2019-12-23 ENCOUNTER — Encounter: Payer: Self-pay | Admitting: Family Medicine

## 2019-12-23 NOTE — Progress Notes (Signed)
[facility]: Attending Note: I have reviewed the chart, discussed wit the Sports Medicine Fellow. I agree with assessment and treatment plan as detailed in the Fellow's note. 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 Positve family history. Reports negative work up to date by PCP.Symptoms are mild at this time including intermitted swelling, waxing and waning joint pains in hands and wrists;  very occasional large joint pain of hip on right or knees that lasts hours to single day. She has had relief from NSAIDS before  With no GI upset and we will try short course again. I discussed with her that chronic NSAID therapy is not ideal but will let her PCP manage that as well as PCP to decide when / need to  make referral to rheumatologist if her symptoms continue or worsen. She is amenable to this plan. States she  reassured. Can follow up PRN with [place] as well if she desires, but not sure we have a lot of additional options to offer her. We did recommend continued activity and exercise to keep joints mobile, no tobacco smoking, weight managment.

## 2020-01-24 ENCOUNTER — Telehealth: Payer: Self-pay

## 2020-01-24 NOTE — Telephone Encounter (Signed)
-----   Message from Lizbeth Bark sent at 01/24/2020  9:33 AM EDT ----- Regarding: phone message Pt is asking for referral to rheumatology for rt hand pain/swelling. She  was told at her visit with Dr. Jennette Kettle to give it some time and if swelling/pain doesn't go away call us for the referral.

## 2020-01-25 NOTE — Telephone Encounter (Signed)
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  In reality, since her PCP follows her all the time and I am probably not going to see her back, I think even if her insurance does not require it would be better if her PCP made the referral.  Thank!.  Please let her know. Denny Levy

## 2020-01-26 NOTE — Telephone Encounter (Signed)
Pt is aware.  

## 2023-03-14 ENCOUNTER — Encounter (HOSPITAL_BASED_OUTPATIENT_CLINIC_OR_DEPARTMENT_OTHER): Payer: Self-pay | Admitting: Emergency Medicine

## 2023-03-14 ENCOUNTER — Emergency Department (HOSPITAL_BASED_OUTPATIENT_CLINIC_OR_DEPARTMENT_OTHER)
Admission: EM | Admit: 2023-03-14 | Discharge: 2023-03-14 | Disposition: A | Discharge status: Home / Self Care | Payer: Managed Care, Other (non HMO) | Attending: Emergency Medicine | Admitting: Emergency Medicine

## 2023-03-14 ENCOUNTER — Emergency Department (HOSPITAL_BASED_OUTPATIENT_CLINIC_OR_DEPARTMENT_OTHER): Payer: Managed Care, Other (non HMO)

## 2023-03-14 ENCOUNTER — Other Ambulatory Visit: Payer: Self-pay

## 2023-03-14 DIAGNOSIS — R1031 Right lower quadrant pain: Secondary | ICD-10-CM | POA: Diagnosis present

## 2023-03-14 DIAGNOSIS — N23 Unspecified renal colic: Secondary | ICD-10-CM

## 2023-03-14 LAB — URINALYSIS, ROUTINE W REFLEX MICROSCOPIC
Bilirubin Urine: NEGATIVE
Glucose, UA: NEGATIVE mg/dL
Nitrite: NEGATIVE
Protein, ur: 30 mg/dL — AB
RBC / HPF: >50 RBC/hpf (ref 0–5)
Specific Gravity, Urine: 1.037 — ABNORMAL HIGH (ref 1.005–1.030)
pH: 6 (ref 5.0–8.0)

## 2023-03-14 NOTE — ED Provider Notes (Signed)
[place] EMERGENCY DEPARTMENT AT [facility] Provider Note   CSN: [id number] Arrival date & time: 03/14/23  1015     History  Chief Complaint  Patient presents with   Flank Pain    Suzanne Rice is a 32 y.o. female.  Patient with history of kidney stones, no previous abdominal surgeries--presents to the emergency department today for evaluation of right lower quadrant and right sided flank pain.  Patient started having symptoms early yesterday morning.  She felt a discomfort, not really a pain, that persisted during the day.  Last night the pain increased.  She had intermittent pains with nausea without vomiting.  She noted hematuria.  She went to urgent care today.  She had an x-ray demonstrating a left intrarenal stone.  She had a UA showing blood, trace leuk esterase.  Her creatinine was slightly elevated at 1.3.  She was sent to the emergency department for further evaluation.  She does report methotrexate use due to rheumatoid arthritis history.  She received Toradol IM at urgent care and feels better currently in regards to the pain.       Home Medications Prior to Admission medications   Medication Sig Start Date End Date Taking? Authorizing Provider  cyclobenzaprine (FLEXERIL) 10 MG tablet TAKE 1 TABLET (10 MG TOTAL) BY MOUTH AT BEDTIME. Patient not taking: Reported on 12/22/2019 06/25/14   Porfirio Oar, PA  Escitalopram Oxalate (LEXAPRO PO) Take by mouth daily.    [provider]  FLUoxetine (PROZAC) 40 MG capsule Take 40 mg by mouth daily. 12/08/19   [provider]  hydrOXYzine (ATARAX/VISTARIL) 25 MG tablet Take 25 mg by mouth 3 (three) times daily. PRN 12/20/19   [provider]  medroxyPROGESTERone (DEPO-PROVERA) 150 MG/ML injection Inject 150 mg into the muscle every 3 (three) months.    [provider]  meloxicam (MOBIC) 15 MG tablet Take 1 tablet (15 mg total) by mouth daily. 12/22/19   Nestor Ramp, MD  phentermine 37.5 MG  capsule Take 37.5 mg by mouth every morning. 12/19/19   [provider]      Allergies    Patient has no known allergies.    Review of Systems   Review of Systems  Physical Exam Updated Vital Signs BP 123/83 (BP Location: Right Arm)   Pulse 98   Temp 98.1 F (36.7 C)   Resp 17   Ht 5\' 4"  (1.626 m)   Wt 77.1 kg   SpO2 100%   BMI 29.18 kg/m  Physical Exam Vitals and nursing note reviewed.  Constitutional:      General: She is not in acute distress.    Appearance: She is well-developed.  HENT:     Head: Normocephalic and atraumatic.     Right Ear: External ear normal.     Left Ear: External ear normal.     Nose: Nose normal.  Eyes:     Conjunctiva/sclera: Conjunctivae normal.  Cardiovascular:     Rate and Rhythm: Normal rate.  Pulmonary:     Effort: No respiratory distress.     Breath sounds: No wheezing, rhonchi or rales.  Abdominal:     Palpations: Abdomen is soft.     Tenderness: There is no abdominal tenderness. There is no guarding or rebound.  Musculoskeletal:     Cervical back: Normal range of motion and neck supple.     Right lower leg: No edema.     Left lower leg: No edema.  Skin:    General:  Skin is warm and dry.  Neurological:     General: No focal deficit present.     Mental Status: She is alert. Mental status is at baseline.     Motor: No weakness.  Psychiatric:        Mood and Affect: Mood normal.     ED Results / Procedures / Treatments   Labs (all labs ordered are listed, but only abnormal results are displayed) Labs Reviewed  URINALYSIS, ROUTINE W REFLEX MICROSCOPIC - Abnormal; Notable for the following components:      Result Value   Specific Gravity, Urine 1.037 (*)    Hgb urine dipstick MODERATE (*)    Ketones, ur TRACE (*)    Protein, ur 30 (*)    Leukocytes,Ua MODERATE (*)    Bacteria, UA RARE (*)    All other components within normal limits    EKG None  Radiology CT Renal Stone Study  Result Date:  03/14/2023 CLINICAL DATA:  History of nephrolithiasis with right flank pain EXAM: CT ABDOMEN AND PELVIS WITHOUT CONTRAST TECHNIQUE: Multidetector CT imaging of the abdomen and pelvis was performed following the standard protocol without IV contrast. RADIATION DOSE REDUCTION: This exam was performed according to the departmental dose-optimization program which includes automated exposure control, adjustment of the mA and/or kV according to patient size and/or use of iterative reconstruction technique. COMPARISON:  None Available. FINDINGS: Lower chest: No focal consolidation or pulmonary nodule in the lung bases. No pleural effusion or pneumothorax demonstrated. Partially imaged heart size is normal. Hepatobiliary: The superior most aspect of the liver is not included within the field of view. No focal hepatic lesions. No intra or extrahepatic biliary ductal dilation. Normal gallbladder. Pancreas: No focal lesions or main ductal dilation. Spleen: Normal in size without focal abnormality. Adrenals/Urinary Tract: No adrenal nodules. Mild right hydroureteronephrosis to the level of a 2 mm stone at the ureterovesical junction. Additional nonobstructing 3 mm left lower pole stone. No focal bladder wall thickening. Stomach/Bowel: Normal appearance of the stomach. No evidence of bowel wall thickening, distention, or inflammatory changes. Normal appendix. Vascular/Lymphatic: No significant vascular findings are present. No enlarged abdominal or pelvic lymph nodes. Reproductive: No adnexal masses. Other: No free fluid, fluid collection, or free air. Musculoskeletal: No acute or abnormal lytic or blastic osseous lesions. IMPRESSION: 1. Mild right hydroureteronephrosis to the level of a 2 mm stone at the ureterovesical junction. 2. Additional nonobstructing 3 mm left lower pole stone. Electronically Signed   By: Agustin Cree M.D.   On: 03/14/2023 12:20    Procedures Procedures    Medications Ordered in ED Medications - No  data to display  ED Course/ Medical Decision Making/ A&P    Patient seen and examined. History obtained directly from patient.  Reviewed urgent care notes.  They considered giving her an antibiotic for trace leukocyte esterase on dipstick, however this may just be due to white blood cells noted in the urine due to the hematuria.  Would like to check microscopic.  Pregnancy test confirmed negative.  Labs/EKG: Ordered UA.  Imaging: Ordered CT renal protocol.  Medications/Fluids: None ordered  Most recent vital signs reviewed and are as follows: BP 123/83 (BP Location: Right Arm)   Pulse 98   Temp 98.1 F (36.7 C)   Resp 17   Ht 5\' 4"  (1.626 m)   Wt 77.1 kg   SpO2 100%   BMI 29.18 kg/m   Initial impression: Right flank pain with hematuria, likely ureteral colic.  12:40 PM  Reassessment performed. Patient appears stable, comfortable.  Labs personally reviewed and interpreted including: UA, denying blood, 11-20 white blood cells with rare bacteria and negative nitrite.  Overall I have low concern for UTI/pyelonephritis at this time given her clinical picture.  Imaging personally visualized and interpreted including: CT scan of the abdomen and pelvis, agree 2 mm UVJ stone on the right which is likely causing her pain and hematuria.  Reviewed pertinent lab work and imaging with patient at bedside. Questions answered.   Most current vital signs reviewed and are as follows: BP 123/83 (BP Location: Right Arm)   Pulse 98   Temp 98.1 F (36.7 C)   Resp 17   Ht 5\' 4"  (1.626 m)   Wt 77.1 kg   SpO2 100%   BMI 29.18 kg/m   Plan: Discharge to home.   Prescriptions written for: None.  Patient has prescriptions for hydrocodone, tamsulosin and Zofran written from urgent care.  We did discuss the cefdinir antibiotic prescribed by urgent care.  We discussed that she could probably hold this and not take this unless she developed a fever or signs of be more concerning for a kidney infection.   She verbalized understanding.  Patient counseled on kidney stone treatment. Urged patient to strain urine and save any stones. Urged urology follow-up with any complications. Counseled patient to maintain good fluid intake.   Counseled patient on use of Flomax.                                Medical Decision Making Amount and/or Complexity of Data Reviewed Labs: ordered. Radiology: ordered.   For this patient's complaint of abdominal pain, the following conditions were considered on the differential diagnosis: gastritis/PUD, enteritis/duodenitis, appendicitis, cholelithiasis/cholecystitis, cholangitis, pancreatitis, ruptured viscus, colitis, diverticulitis, small/large bowel obstruction, proctitis, cystitis, pyelonephritis, ureteral colic, aortic dissection, aortic aneurysm. In women, ectopic pregnancy, pelvic inflammatory disease, ovarian cysts, and tubo-ovarian abscess were also considered. Atypical chest etiologies were also considered including ACS, PE, and pneumonia.  The patient's vital signs, pertinent lab work and imaging were reviewed and interpreted as discussed in the ED course. Hospitalization was considered for further testing, treatments, or serial exams/observation. However as patient is well-appearing, has a stable exam, and reassuring studies today, I do not feel that they warrant admission at this time. This plan was discussed with the patient who verbalizes agreement and comfort with this plan and seems reliable and able to return to the Emergency Department with worsening or changing symptoms.          Final Clinical Impression(s) / ED Diagnoses Final diagnoses:  Ureteral colic    Rx / DC Orders ED Discharge Orders     None         Renne Crigler, PA-C 03/14/23 1242    Derwood Kaplan, MD 03/15/23 [phone]

## 2023-03-14 NOTE — ED Triage Notes (Signed)
Pt arrives to ED from UC with c/o right sided flank pain that started yesterday with associated hematuria and urinary urgency. Had blood work and urine completed at [facility] also received Toradol shot.

## 2023-03-14 NOTE — Discharge Instructions (Signed)
Please read and follow all provided instructions.  Your diagnoses today include:  1. Ureteral colic     Tests performed today include: Urine test that showed blood in your urine and overall low suspicion for infection CT scan which showed a 2 millimeter kidney stone on the right side and a 3 mm stone inside of the left kidney Vital signs. See below for your results today.   Medications prescribed:  Use the pain medicine, nausea medication and tamsulosin prescribed by the urgent care.  I think that it would probably be okay for you to hold off on filling the cefdinir antibiotic medication unless you developed fever and more persistent flank pain which would be consistent with a kidney infection.  If you do develop these symptoms, please follow-up with your doctor or the urologist soon as possible, or return to the emergency department for further evaluation as this is a more serious situation.  Take any prescribed medications only as directed.  Home care instructions:  Follow any educational materials contained in this packet.  Please double your fluid intake for the next several days. Strain your urine and save any stones that may pass.   Follow-up instructions: Please follow-up with your urologist or the urologist referral (provided on front page) in the next 1 week for further evaluation of your symptoms.  Return instructions:  Please return to the Emergency Department if you experience worsening symptoms.  Please return if you develop fever or uncontrolled pain or vomiting. Please return if you have any other emergent concerns.  Additional Information:  Your vital signs today were: BP 123/83 (BP Location: Right Arm)   Pulse 98   Temp 98.1 F (36.7 C)   Resp 17   Ht 5\' 4"  (1.626 m)   Wt 77.1 kg   SpO2 100%   BMI 29.18 kg/m  If your blood pressure (BP) was elevated above 135/85 this visit, please have this repeated by your doctor within one month. --------------

## 2023-04-19 ENCOUNTER — Other Ambulatory Visit: Payer: Managed Care, Other (non HMO)

## 2023-04-19 ENCOUNTER — Other Ambulatory Visit: Payer: Self-pay | Admitting: Orthopedic Surgery

## 2023-04-19 DIAGNOSIS — M25521 Pain in right elbow: Secondary | ICD-10-CM

## 2023-04-20 ENCOUNTER — Ambulatory Visit
Admission: RE | Admit: 2023-04-20 | Discharge: 2023-04-20 | Disposition: A | Discharge status: Home / Self Care | Payer: Managed Care, Other (non HMO) | Source: Ambulatory Visit | Attending: Orthopedic Surgery

## 2023-04-20 DIAGNOSIS — M25521 Pain in right elbow: Secondary | ICD-10-CM

## 2023-05-04 HISTORY — PX: FRACTURE SURGERY: SHX138

## 2023-05-25 NOTE — H&P (Signed)
Suzanne Rice, Suzanne Rice MEDICAL RECORD NO: [id number] ACCOUNT NO: [id number] DATE OF BIRTH: [birth year] PHYSICIAN: Juluis Mire, MD  History and Physical   Date of her surgery is 06/07/2023 at [facility] here in [place].  It will be done at [facility] outpatient area.  HISTORY OF PRESENT ILLNESS:  The patient is a 32 year old gravida 1, para 1 female who presents for removal of Nexplanon and bilateral tubal ligation.  In relation to the present admission, the patient has a Nexplanon in her own that is due to be changed out this year.  She has decided to proceed with permanent sterilization.  She is going to be admitted at this point in time for removal of Nexplanon  and bilateral tubal ligation.  ALLERGIES:  She has no known drug allergies.  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  She has been on Wegovy in the past.  PAST MEDICAL HISTORY:  The patient had usual childhood diseases without sequelae.  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  She has had a vaginal delivery, but no other surgical intervention.  SOCIAL HISTORY:  Reveals tobacco use, but no alcohol use.  FAMILY HISTORY:  Noncontributory.  PHYSICAL EXAMINATION:  VITAL SIGNS:  The patient is afebrile with stable vital signs. HEENT:  The patient is normocephalic.  Pupils equal, round and reactive to light and accommodation. LUNGS:  Clear. CARDIOVASCULAR:  Regular rhythm and rate without murmurs or gallops. ABDOMEN:  Benign.  No masses, organomegaly, or tenderness. PELVIC: Normal external genitalia.  Vaginal mucosa is clear.  Cervix is unremarkable.  Uterus normal size, shape, and contour.  Adnexa free of masses or tenderness. EXTREMITIES:  Trace edema. NEUROLOGIC:  Grossly within normal limits.  ASSESSMENT:  1.  Multiparity, desires sterility. 2.  Nexplanon in place to be removed and the patient will undergo removal of the Nexplanon and laparoscopic  bilateral tubal surgery.  We will remove both fallopian tubes.  The risks of surgery have been discussed including the risk of infection, the risk  of hemorrhage that could require transfusion with the risk of AIDS or hepatitis.  Risk of injury to adjacent organs including bladder, bowel, ureters.  Risk of deep venous thrombosis and pulmonary embolus.  Potential irreversibility of sterilization was  explained.  All questions are answered.  The patient is scheduled for the above noted procedure.      PAA D: 05/25/2023 5:28:46 am T: 05/25/2023 7:34:00 am  JOB: [id number]/ [id number]

## 2023-05-25 NOTE — H&P (Signed)
Patient name   Suzanne Rice, Suzanne Rice DICTATION# [id number] QMV#[id number]  Richardean Chimera, MD 05/25/2023 5:29 AM

## 2023-05-31 ENCOUNTER — Encounter (HOSPITAL_BASED_OUTPATIENT_CLINIC_OR_DEPARTMENT_OTHER): Payer: Self-pay | Admitting: Obstetrics and Gynecology

## 2023-05-31 NOTE — Progress Notes (Signed)
Spoke w/ via phone for pre-op interview--- Suzanne Rice Lab needs dos---- HCG (Serum), HIV, CBC, RPR and T&S per surgeon.        Lab results------ COVID test -----patient states asymptomatic no test needed Arrive at -------0530 NPO after MN NO Solid Food.   Med rec completed Medications to take morning of surgery -----NONE Diabetic medication ----- Patient instructed no nail polish to be worn day of surgery Patient instructed to bring photo id and insurance card day of surgery Patient aware to have Driver (ride ) / caregiver    for 24 hours after surgery - Mother Suzanne Rice Patient Special Instructions ----- Last Ozempic dose 05/23/23, pt verbalized understanding that she should not have dose within 7 days of surgery. Pre-Op special Instructions ----- Patient verbalized understanding of instructions that were given at this phone interview. Patient denies chest pain, sob, fever, cough at the interview.   Pt has right elbow fracture surgery, has to wear hinge brace, unable to extend arm.

## 2023-06-07 ENCOUNTER — Other Ambulatory Visit: Payer: Self-pay

## 2023-06-07 ENCOUNTER — Encounter (HOSPITAL_BASED_OUTPATIENT_CLINIC_OR_DEPARTMENT_OTHER): Payer: Self-pay | Admitting: Obstetrics and Gynecology

## 2023-06-07 ENCOUNTER — Ambulatory Visit (HOSPITAL_BASED_OUTPATIENT_CLINIC_OR_DEPARTMENT_OTHER): Payer: Managed Care, Other (non HMO) | Admitting: Anesthesiology

## 2023-06-07 ENCOUNTER — Ambulatory Visit (HOSPITAL_BASED_OUTPATIENT_CLINIC_OR_DEPARTMENT_OTHER)
Admission: RE | Admit: 2023-06-07 | Discharge: 2023-06-07 | Disposition: A | Discharge status: Home / Self Care | Payer: Managed Care, Other (non HMO) | Attending: Obstetrics and Gynecology | Admitting: Obstetrics and Gynecology

## 2023-06-07 ENCOUNTER — Encounter (HOSPITAL_BASED_OUTPATIENT_CLINIC_OR_DEPARTMENT_OTHER)
Admission: RE | Disposition: A | Discharge status: Home / Self Care | Payer: Self-pay | Source: Home / Self Care | Attending: Obstetrics and Gynecology

## 2023-06-07 DIAGNOSIS — Z641 Problems related to multiparity: Secondary | ICD-10-CM | POA: Diagnosis not present

## 2023-06-07 DIAGNOSIS — F17200 Nicotine dependence, unspecified, uncomplicated: Secondary | ICD-10-CM | POA: Diagnosis not present

## 2023-06-07 DIAGNOSIS — Z4002 Encounter for prophylactic removal of ovary: Secondary | ICD-10-CM

## 2023-06-07 DIAGNOSIS — E669 Obesity, unspecified: Secondary | ICD-10-CM

## 2023-06-07 DIAGNOSIS — F53 Postpartum depression: Secondary | ICD-10-CM

## 2023-06-07 DIAGNOSIS — Z302 Encounter for sterilization: Secondary | ICD-10-CM | POA: Diagnosis not present

## 2023-06-07 DIAGNOSIS — Z3046 Encounter for surveillance of implantable subdermal contraceptive: Secondary | ICD-10-CM | POA: Diagnosis present

## 2023-06-07 HISTORY — DX: Other specified postprocedural states: Z98.890

## 2023-06-07 HISTORY — PX: LAPAROSCOPIC UNILATERAL SALPINGECTOMY: SHX5934

## 2023-06-07 HISTORY — PX: REMOVAL OF NON VAGINAL CONTRACEPTIVE DEVICE: SHX6219

## 2023-06-07 LAB — TYPE AND SCREEN
ABO/RH(D): O POS
Antibody Screen: NEGATIVE

## 2023-06-07 LAB — CBC
HCT: 39.3 % (ref 36.0–46.0)
Hemoglobin: 13.5 g/dL (ref 12.0–15.0)
MCH: 31.8 pg (ref 26.0–34.0)
MCHC: 34.4 g/dL (ref 30.0–36.0)
MCV: 92.5 fL (ref 80.0–100.0)
Platelets: 344 10*3/uL (ref 150–400)
RBC: 4.25 MIL/uL (ref 3.87–5.11)
RDW: 13.1 % (ref 11.5–15.5)
WBC: 8.2 10*3/uL (ref 4.0–10.5)
nRBC: 0 % (ref 0.0–0.2)

## 2023-06-07 LAB — HCG, SERUM, QUALITATIVE: Preg, Serum: NEGATIVE

## 2023-06-07 LAB — POCT PREGNANCY, URINE: Preg Test, Ur: NEGATIVE

## 2023-06-07 SURGERY — SALPINGECTOMY, UNILATERAL, LAPAROSCOPIC
Anesthesia: General | Site: Arm Upper | Laterality: Left

## 2023-06-07 MED ORDER — LIDOCAINE 2% (20 MG/ML) 5 ML SYRINGE
INTRAMUSCULAR | Status: DC | PRN
  Administered 2023-06-07: 100 mg via INTRAVENOUS

## 2023-06-07 MED ORDER — DROPERIDOL 2.5 MG/ML IJ SOLN
INTRAMUSCULAR | Status: AC
  Filled 2023-06-07: qty 2

## 2023-06-07 MED ORDER — ONDANSETRON HCL 4 MG/2ML IJ SOLN
INTRAMUSCULAR | Status: AC
  Filled 2023-06-07: qty 2

## 2023-06-07 MED ORDER — SUGAMMADEX SODIUM 200 MG/2ML IV SOLN
INTRAVENOUS | Status: DC | PRN
  Administered 2023-06-07: 200 mg via INTRAVENOUS

## 2023-06-07 MED ORDER — ROCURONIUM BROMIDE 10 MG/ML (PF) SYRINGE
PREFILLED_SYRINGE | INTRAVENOUS | Status: AC
  Filled 2023-06-07: qty 10

## 2023-06-07 MED ORDER — SODIUM CHLORIDE 0.9 % IV SOLN: INTRAVENOUS | Status: DC | PRN

## 2023-06-07 MED ORDER — FENTANYL CITRATE (PF) 100 MCG/2ML IJ SOLN
INTRAMUSCULAR | Status: AC
  Filled 2023-06-07: qty 2

## 2023-06-07 MED ORDER — PROPOFOL 10 MG/ML IV BOLUS
INTRAVENOUS | Status: AC
  Filled 2023-06-07: qty 20

## 2023-06-07 MED ORDER — FENTANYL CITRATE (PF) 100 MCG/2ML IJ SOLN: 25.0000 ug | INTRAMUSCULAR | Status: DC | PRN

## 2023-06-07 MED ORDER — MIDAZOLAM HCL 5 MG/5ML IJ SOLN
INTRAMUSCULAR | Status: DC | PRN
  Administered 2023-06-07: 2 mg via INTRAVENOUS

## 2023-06-07 MED ORDER — OXYCODONE HCL 5 MG PO TABS: 5.0000 mg | ORAL_TABLET | Freq: Once | ORAL | Status: DC | PRN

## 2023-06-07 MED ORDER — CEFAZOLIN SODIUM-DEXTROSE 2-4 GM/100ML-% IV SOLN
2.0000 g | INTRAVENOUS | Status: AC
Start: 2023-06-07 — End: 2023-06-07
  Administered 2023-06-07: 2 g via INTRAVENOUS

## 2023-06-07 MED ORDER — LACTATED RINGERS IV SOLN: INTRAVENOUS | Status: DC

## 2023-06-07 MED ORDER — DEXAMETHASONE SODIUM PHOSPHATE 10 MG/ML IJ SOLN
INTRAMUSCULAR | Status: DC | PRN
  Administered 2023-06-07: 10 mg via INTRAVENOUS

## 2023-06-07 MED ORDER — LIDOCAINE HCL (PF) 2 % IJ SOLN
INTRAMUSCULAR | Status: AC
  Filled 2023-06-07: qty 5

## 2023-06-07 MED ORDER — DROPERIDOL 2.5 MG/ML IJ SOLN
INTRAMUSCULAR | Status: DC | PRN
  Administered 2023-06-07: .625 mg via INTRAVENOUS

## 2023-06-07 MED ORDER — DEXMEDETOMIDINE HCL IN NACL 200 MCG/50ML IV SOLN
INTRAVENOUS | Status: DC | PRN
  Administered 2023-06-07: 12 ug via INTRAVENOUS

## 2023-06-07 MED ORDER — KETOROLAC TROMETHAMINE 30 MG/ML IJ SOLN
INTRAMUSCULAR | Status: AC
  Filled 2023-06-07: qty 1

## 2023-06-07 MED ORDER — DEXAMETHASONE SODIUM PHOSPHATE 10 MG/ML IJ SOLN
INTRAMUSCULAR | Status: AC
  Filled 2023-06-07: qty 1

## 2023-06-07 MED ORDER — CEFAZOLIN SODIUM-DEXTROSE 2-4 GM/100ML-% IV SOLN
INTRAVENOUS | Status: AC
  Filled 2023-06-07: qty 100

## 2023-06-07 MED ORDER — KETOROLAC TROMETHAMINE 30 MG/ML IJ SOLN
INTRAMUSCULAR | Status: DC | PRN
  Administered 2023-06-07: 30 mg via INTRAVENOUS

## 2023-06-07 MED ORDER — STERILE WATER FOR IRRIGATION IR SOLN
Status: DC | PRN
  Administered 2023-06-07: 1000 mL

## 2023-06-07 MED ORDER — ROCURONIUM BROMIDE 10 MG/ML (PF) SYRINGE
PREFILLED_SYRINGE | INTRAVENOUS | Status: DC | PRN
  Administered 2023-06-07: 50 mg via INTRAVENOUS

## 2023-06-07 MED ORDER — ONDANSETRON HCL 4 MG/2ML IJ SOLN
INTRAMUSCULAR | Status: DC | PRN
  Administered 2023-06-07: 4 mg via INTRAVENOUS

## 2023-06-07 MED ORDER — ONDANSETRON HCL 4 MG/2ML IJ SOLN: 4.0000 mg | Freq: Once | INTRAMUSCULAR | Status: DC | PRN

## 2023-06-07 MED ORDER — POVIDONE-IODINE 10 % EX SWAB: 2.0000 | Freq: Once | CUTANEOUS | Status: DC

## 2023-06-07 MED ORDER — KETOROLAC TROMETHAMINE 30 MG/ML IJ SOLN: 30.0000 mg | Freq: Once | INTRAMUSCULAR | Status: DC | PRN

## 2023-06-07 MED ORDER — FENTANYL CITRATE (PF) 100 MCG/2ML IJ SOLN
INTRAMUSCULAR | Status: DC | PRN
  Administered 2023-06-07 (×3): 50 ug via INTRAVENOUS

## 2023-06-07 MED ORDER — OXYCODONE HCL 5 MG/5ML PO SOLN: 5.0000 mg | Freq: Once | ORAL | Status: DC | PRN

## 2023-06-07 MED ORDER — PROPOFOL 10 MG/ML IV BOLUS
INTRAVENOUS | Status: DC | PRN
  Administered 2023-06-07: 150 mg via INTRAVENOUS

## 2023-06-07 MED ORDER — MIDAZOLAM HCL 2 MG/2ML IJ SOLN
INTRAMUSCULAR | Status: AC
  Filled 2023-06-07: qty 2

## 2023-06-07 SURGICAL SUPPLY — 46 items
ADH SKN CLS APL DERMABOND .7 (GAUZE/BANDAGES/DRESSINGS) ×2
APL SRG 38 LTWT LNG FL B (MISCELLANEOUS) ×2
APL SWBSTK 6 STRL LF DISP (MISCELLANEOUS)
APPLICATOR ARISTA FLEXITIP XL (MISCELLANEOUS) IMPLANT
APPLICATOR COTTON TIP 6 STRL (MISCELLANEOUS) IMPLANT
APPLICATOR COTTON TIP 6IN STRL (MISCELLANEOUS) IMPLANT
CATH ROBINSON RED A/P 16FR (CATHETERS) ×2 IMPLANT
COVER MAYO STAND STRL (DRAPES) ×2 IMPLANT
DERMABOND ADVANCED .7 DNX12 (GAUZE/BANDAGES/DRESSINGS) ×2 IMPLANT
DRAPE SURG IRRIG POUCH 19X23 (DRAPES) ×2 IMPLANT
DURAPREP 26ML APPLICATOR (WOUND CARE) ×2 IMPLANT
ELECT REM PT RETURN 9FT ADLT (ELECTROSURGICAL) ×2
ELECTRODE REM PT RTRN 9FT ADLT (ELECTROSURGICAL) ×2 IMPLANT
GAUZE 4X4 16PLY ~~LOC~~+RFID DBL (SPONGE) ×4 IMPLANT
GLOVE BIO SURGEON STRL SZ7 (GLOVE) ×4 IMPLANT
GOWN STRL REUS W/TWL XL LVL3 (GOWN DISPOSABLE) ×2 IMPLANT
HEMOSTAT ARISTA ABSORB 3G PWDR (HEMOSTASIS) IMPLANT
IRRIG SUCT STRYKERFLOW 2 WTIP (MISCELLANEOUS) ×2
IRRIGATION SUCT STRKRFLW 2 WTP (MISCELLANEOUS) ×2 IMPLANT
KIT PINK PAD W/HEAD ARE REST (MISCELLANEOUS) ×4
KIT PINK PAD W/HEAD ARM REST (MISCELLANEOUS) ×4 IMPLANT
KIT TURNOVER CYSTO (KITS) ×2 IMPLANT
NDL INSUFFLATION 14GA 120MM (NEEDLE) IMPLANT
NEEDLE INSUFFLATION 14GA 120MM (NEEDLE) IMPLANT
NS IRRIG 500ML POUR BTL (IV SOLUTION) ×2 IMPLANT
PACK LAPAROSCOPY BASIN (CUSTOM PROCEDURE TRAY) ×2 IMPLANT
PAD OB MATERNITY 4.3X12.25 (PERSONAL CARE ITEMS) ×2 IMPLANT
PAD PREP 24X48 CUFFED NSTRL (MISCELLANEOUS) ×2 IMPLANT
SCISSORS LAP 5X45 EPIX DISP (ENDOMECHANICALS) IMPLANT
SEALER TISSUE G2 CVD JAW 45CM (ENDOMECHANICALS) IMPLANT
SET TUBE SMOKE EVAC HIGH FLOW (TUBING) ×2 IMPLANT
SLEEVE SCD COMPRESS KNEE MED (STOCKING) ×2 IMPLANT
SUT VIC AB 3-0 PS2 18 (SUTURE) ×2
SUT VIC AB 3-0 PS2 18XBRD (SUTURE) ×2 IMPLANT
SUT VIC AB 4-0 PS2 18 (SUTURE) IMPLANT
SUT VICRYL 0 ENDOLOOP (SUTURE) IMPLANT
SUT VICRYL 0 UR6 27IN ABS (SUTURE) IMPLANT
SYS BAG RETRIEVAL 10MM (BASKET)
SYSTEM BAG RETRIEVAL 10MM (BASKET) IMPLANT
TOWEL OR 17X24 6PK STRL BLUE (TOWEL DISPOSABLE) ×4 IMPLANT
TROCAR BALLN 12MMX100 BLUNT (TROCAR) IMPLANT
TROCAR Z-THREAD BLADED 11X100M (TROCAR) IMPLANT
TROCAR Z-THREAD BLADED 5X100MM (TROCAR) ×2 IMPLANT
TUBE CONNECTING 12X1/4 (SUCTIONS) IMPLANT
WARMER LAPAROSCOPE (MISCELLANEOUS) ×2 IMPLANT
WATER STERILE IRR 500ML POUR (IV SOLUTION) IMPLANT

## 2023-06-07 NOTE — Anesthesia Postprocedure Evaluation (Signed)
Anesthesia Post Note  Patient: Suzanne Rice  Procedure(s) Performed: LAPAROSCOPIC SALPINGECTOMY (Bilateral: Abdomen) REMOVAL OF NON VAGINAL CONTRACEPTIVE DEVICE (Left: Arm Upper)     Patient location during evaluation: PACU Anesthesia Type: General Level of consciousness: awake and alert Pain management: pain level controlled Vital Signs Assessment: post-procedure vital signs reviewed and stable Respiratory status: spontaneous breathing, nonlabored ventilation, respiratory function stable and patient connected to nasal cannula oxygen Cardiovascular status: blood pressure returned to baseline and stable Postop Assessment: no apparent nausea or vomiting Anesthetic complications: no  No notable events documented.  Last Vitals:  Vitals:   06/07/23 0915 06/07/23 0930  BP: 115/68 105/73  Pulse: 75 71  Resp: 16 18  Temp:    SpO2: 95% 97%    Last Pain:  Vitals:   06/07/23 0930  TempSrc:   PainSc: 4                  Lucyann Romano S

## 2023-06-07 NOTE — Op Note (Signed)
Suzanne Rice, RAPE MEDICAL RECORD NO: [id number] ACCOUNT NO: [id number] DATE OF BIRTH: [birth year] FACILITY: [facility] LOCATION: [facility] PHYSICIAN: Juluis Mire, MD  Operative Report   DATE OF PROCEDURE: 06/07/2023   PREOPERATIVE DIAGNOSES: 1.  Multiparity. 2.  Desires sterility. 3.  Nexplanon in place.  POSTOPERATIVE DIAGNOSES: 1.  Multiparity. 2.  Desires sterility. 3.  Nexplanon in place.   PROCEDURE:  Laparoscopic bilateral tubal removal followed by removal of Nexplanon.  SURGEON: Juluis Mire, MD.  ANESTHESIA: General endotracheal.  ESTIMATED BLOOD LOSS: Minimal.  PACKS AND DRAINS: None.  INTRAOPERATIVE BLOOD REPLACEMENT:  None.  COMPLICATIONS:  None.  INDICATIONS: Dictated in the history and physical.  DESCRIPTION OF PROCEDURE: The patient was brought back to the OR and placed in the supine position.  After a satisfactory level of general endotracheal anesthesia was obtained, the patient was placed in the dorsal lithotomy position using the Allen  stirrups.  The abdomen, perineum, and vagina were prepped with Betadine.  Bladder was then drained by in and out catheterization.  A Hulka tenaculum was put in place and secured.  The patient was then draped in a sterile field.  A subumbilical incision  was made with a knife.  Veress needle was introduced into the abdominal cavity.  Gas inflated approximately 4 L of carbon dioxide.  Operative laparoscope was then introduced.  Laparoscope was introduced.  There was no evidence of injury to adjacent  organs.  A 5 mm trocar was put in place in the suprapubic area.  Visualization revealed the uterus to be normal size and shape.  Tubes and ovaries were unremarkable.  There was no evidence of pelvic pathology.  The appendix was visualized and noted to be  normal.  Upper abdomen including liver, tip of the gallbladder were clear.  Using the NCL, we first went to the right tube.  The tube was elevated.  Using the NCL, the tube was  separated from its underlying mesentery attachments.  Then, it was  completely excised and passed out through the operative field to the suprapubic trocar.  Next, we went to the left side.  Using NCL again, we dissected the tube from its underlying mesentery.  We came across the tube and cauterized it.  The tube was also  removed through the suprapubic trocar.  We had bleeding from the left side.  We brought the bipolar in.  Eventually, we got hemostasis on the left side.  The Arista was then brought in place and used to bring about complete hemostasis we had hemostasis.   Pelvic cavity had been irrigated prior to the Arista.  No active bleeding was encountered at this point in time.  Total blood loss was about 100 mL at most.  At this point, we timed out, included deflated carbon dioxide.  All trocars were removed.  The  subumbilical incision was closed with interrupted subcuticular 4-0 Vicryl.  Suprapubic incision was closed with Dermabond.  Sponge, instrument, and needle count was corrected by a circulating nurse.    We now went to the Nexplanon.  The arm was prepped and draped.  An incision was made over the Nexplanon in.  It was then pushed through the incision and easily removed intact.  Skin was then closed with Steri-Strips and drapes and a compression bandage.     Sponge, instrument and needle count were reported as correct by circulating nurse.  The patient tolerated the procedure well, was alert and extubated, transferred to the recovery room in good condition.  [email or web address] D: 06/07/2023 8:53:27 am T: 06/07/2023 9:16:00 am  JOB: [id number]/ [id number]

## 2023-06-07 NOTE — Anesthesia Procedure Notes (Signed)
Procedure Name: Intubation Date/Time: 06/07/2023 7:46 AM  Performed by: Bishop Limbo, CRNAPre-anesthesia Checklist: Patient identified, Emergency Drugs available, Suction available and Patient being monitored Patient Re-evaluated:Patient Re-evaluated prior to induction Oxygen Delivery Method: Circle System Utilized Preoxygenation: Pre-oxygenation with 100% oxygen Induction Type: IV induction Ventilation: Mask ventilation without difficulty Laryngoscope Size: Mac and 3 Grade View: Grade I Tube type: Oral Tube size: 7.0 mm Number of attempts: 1 Airway Equipment and Method: Stylet Placement Confirmation: ETT inserted through vocal cords under direct vision, positive ETCO2 and breath sounds checked- equal and bilateral Secured at: 22 cm Tube secured with: Tape Dental Injury: Teeth and Oropharynx as per pre-operative assessment

## 2023-06-07 NOTE — H&P (Signed)
  History and physical exam unchanged 

## 2023-06-07 NOTE — Anesthesia Preprocedure Evaluation (Signed)
Anesthesia Evaluation  Patient identified by MRN, date of birth, ID band Patient awake    Reviewed: Allergy & Precautions, H&P , NPO status , Patient's Chart, lab work & pertinent test results  History of Anesthesia Complications (+) PONV and history of anesthetic complications  Airway Mallampati: II  TM Distance: >3 FB Neck ROM: Full    Dental no notable dental hx.    Pulmonary Current Smoker and Patient abstained from smoking.   Pulmonary exam normal breath sounds clear to auscultation       Cardiovascular negative cardio ROS Normal cardiovascular exam Rhythm:Regular Rate:Normal     Neuro/Psych negative neurological ROS  negative psych ROS   GI/Hepatic negative GI ROS, Neg liver ROS,,,  Endo/Other  negative endocrine ROS    Renal/GU negative Renal ROS  negative genitourinary   Musculoskeletal negative musculoskeletal ROS (+)    Abdominal   Peds negative pediatric ROS (+)  Hematology negative hematology ROS (+)   Anesthesia Other Findings   Reproductive/Obstetrics negative OB ROS                             Anesthesia Physical Anesthesia Plan  ASA: 2  Anesthesia Plan: General   Post-op Pain Management: Minimal or no pain anticipated   Induction: Intravenous  PONV Risk Score and Plan: 3 and Ondansetron, Dexamethasone, Droperidol and Treatment may vary due to age or medical condition  Airway Management Planned: Oral ETT  Additional Equipment:   Intra-op Plan:   Post-operative Plan: Extubation in OR  Informed Consent: I have reviewed the patients History and Physical, chart, labs and discussed the procedure including the risks, benefits and alternatives for the proposed anesthesia with the patient or authorized representative who has indicated his/her understanding and acceptance.     Dental advisory given  Plan Discussed with: CRNA and Surgeon  Anesthesia Plan  Comments:        Anesthesia Quick Evaluation

## 2023-06-07 NOTE — Discharge Instructions (Signed)
  Post Anesthesia Home Care Instructions  Activity: Get plenty of rest for the remainder of the day. A responsible individual must stay with you for 24 hours following the procedure.  For the next 24 hours, DO NOT: -Drive a car -Advertising copywriter -Drink alcoholic beverages -Take any medication unless instructed by your physician -Make any legal decisions or sign important papers.  Meals: Start with liquid foods such as gelatin or soup. Progress to regular foods as tolerated. Avoid greasy, spicy, heavy foods. If nausea and/or vomiting occur, drink only clear liquids until the nausea and/or vomiting subsides. Call your physician if vomiting continues.  HOME CARE INSTRUCTIONS - LAPAROSCOPY  Wound Care: The bandaids or dressing which are placed over the skin openings may be removed the day after surgery. The incision should be kept clean and dry. The stitches do not need to be removed. Should the incision become sore, red, and swollen after the first week, check with your doctor.  Personal Hygiene: Shower the day after your procedure. Always wipe from front to back after elimination.   Activity: Do not drive or operate any equipment today. The effects of the anesthesia are still present and drowsiness may result. Rest today, not necessarily flat bed rest, just take it easy. You may resume your normal activity in one to three days or as instructed by your physician.  Sexual Activity: You resume sexual activity as indicated by your physician. If your laparoscopy was for a sterilization ( tubes tied ), continue current method of birth control until after your next period or ask for specific instructions from your doctor.  Diet: Eat a light diet as desired this evening. You may resume a regular diet tomorrow.  Return to Work: Two to three days or as indicated by your doctor.  Expectations After Surgery: Your surgery will cause vaginal drainage or spotting which may continue for 2-3 days. Mild  abdominal discomfort or tenderness is not unusual and some shoulder pain may also be noted which can be relieved by lying flat in pain.  Call Your Doctor If these Occur:  Persistent or heavy bleeding at incision site       Redness or swelling around incision       Elevation of temperature greater than 100 degrees F   May take Ibuprofen, motrin, advil, aleve or any nsaids after 2:45 pm if needed

## 2023-06-07 NOTE — Brief Op Note (Signed)
06/07/2023  9:00 AM  PATIENT:  Suzanne Rice  32 y.o. female  PRE-OPERATIVE DIAGNOSIS:  FAMILY HISTORY OF OVARIAN CANCER  CONTRACEPTION  POST-OPERATIVE DIAGNOSIS:  FAMILY HISTORY OF OVARIAN CANCER CONTRACEPTION  PROCEDURE:  Procedure(s): LAPAROSCOPIC SALPINGECTOMY (Bilateral) REMOVAL OF NON VAGINAL CONTRACEPTIVE DEVICE (Left)  SURGEON:  Surgeons and Role:    * Richardean Chimera, MD - Primary  PHYSICIAN ASSISTANT:   ASSISTANTS: none   ANESTHESIA:   local and general  EBL:  20 mL   BLOOD ADMINISTERED:none  DRAINS: none   LOCAL MEDICATIONS USED:  XYLOCAINE   SPECIMEN:  Source of Specimen:  bilateral fallopian tubes  DISPOSITION OF SPECIMEN:  PATHOLOGY  COUNTS:  YES  TOURNIQUET:  * No tourniquets in log *  DICTATION:  dictation [id number]  PLAN OF CARE: Discharge to home after PACU  PATIENT DISPOSITION:  PACU - hemodynamically stable.   Delay start of Pharmacological VTE agent (>24hrs) due to surgical blood loss or risk of bleeding: not applicable

## 2023-06-07 NOTE — Transfer of Care (Signed)
Immediate Anesthesia Transfer of Care Note  Patient: Suzanne Rice  Procedure(s) Performed: LAPAROSCOPIC SALPINGECTOMY (Bilateral: Abdomen) REMOVAL OF NON VAGINAL CONTRACEPTIVE DEVICE (Left: Arm Upper)  Patient Location: PACU  Anesthesia Type:General  Level of Consciousness: drowsy, patient cooperative, and responds to stimulation  Airway & Oxygen Therapy: Patient Spontanous Breathing  Post-op Assessment: Report given to RN and Post -op Vital signs reviewed and stable  Post vital signs: stable  Last Vitals:  Vitals Value Taken Time  BP 119/78 06/07/23 0900  Temp 37 C 06/07/23 0900  Pulse 71 06/07/23 0908  Resp 17 06/07/23 0908  SpO2 97 % 06/07/23 0908  Vitals shown include unfiled device data.  Last Pain:  Vitals:   06/07/23 0900  TempSrc:   PainSc: Asleep      Patients Stated Pain Goal: 8 (06/07/23 8416)  Complications: No notable events documented.

## 2023-06-08 LAB — SURGICAL PATHOLOGY

## 2023-06-09 ENCOUNTER — Encounter (HOSPITAL_BASED_OUTPATIENT_CLINIC_OR_DEPARTMENT_OTHER): Payer: Self-pay | Admitting: Obstetrics and Gynecology
